# Patient Record
Sex: Female | Born: 1990 | Race: White | Hispanic: No | Marital: Single | Smoking: Former smoker
Health system: Southern US, Community
[De-identification: ages and names within clinical notes are randomized; demographics above are authoritative.]

## PROBLEM LIST (undated history)

## (undated) DIAGNOSIS — R569 Unspecified convulsions: Secondary | ICD-10-CM

## (undated) DIAGNOSIS — F419 Anxiety disorder, unspecified: Secondary | ICD-10-CM

## (undated) DIAGNOSIS — D649 Anemia, unspecified: Secondary | ICD-10-CM

## (undated) HISTORY — PX: EYE SURGERY: SHX253

## (undated) NOTE — *Deleted (*Deleted)
History     CSN: [id number]  Arrival date and time: 09/06/20 1925   First Provider Initiated Contact with Patient 09/06/20 2031      Chief Complaint  Patient presents with  . Contractions   Ms. Taylor Hodge is a 80 y.o. year old [phone] female at [redacted]w[redacted]d weeks gestation who presents to MAU reporting nausea and loose, watery stool since 1700 today. She denies any sick contacts. She reports (+) FM. She receives her [facility] with [facility] in [place], [place].   [place] History    Gravida  9   Para  5   Term  1   Preterm  4   AB  3   Living  5     SAB  2   TAB  1   Ectopic      Multiple      Live Births  5           Past Medical History:  Diagnosis Date  . Anemia   . Anxiety   . Seizures (HCC)     Past Surgical History:  Procedure Laterality Date  . CESAREAN SECTION    . EYE SURGERY      History reviewed. No pertinent family history.  Social History   Tobacco Use  . Smoking status: Former Games developer  . Smokeless tobacco: Never Used  Vaping Use  . Vaping Use: Never used  Substance Use Topics  . Alcohol use: Not Currently  . Drug use: Never    Allergies:  Allergies  Allergen Reactions  . Toradol [Ketorolac Tromethamine] Shortness Of Breath  . Compazine [Prochlorperazine] Anxiety    hives    Medications Prior to Admission  Medication Sig Dispense Refill Last Dose  . acetaminophen (TYLENOL) 500 MG tablet Take 1,000 mg by mouth every 6 (six) hours as needed for moderate pain.   09/06/2020 at Unknown time  . cyclobenzaprine (FLEXERIL) 10 MG tablet Take 1 tablet (10 mg total) by mouth 3 (three) times daily as needed for muscle spasms. 30 tablet 2 09/05/2020 at Unknown time  . folic acid (FOLVITE) 1 MG tablet Take 1 mg by mouth daily.   09/06/2020 at Unknown time  . lamoTRIgine (LAMICTAL) 25 MG tablet Take 25 mg by mouth 2 (two) times daily.   09/06/2020 at Unknown time  . Elastic Bandages & Supports (COMFORT FIT MATERNITY SUPP  MED) MISC 1 Device by Does not apply route daily. 1 each 1     Review of Systems  Constitutional: Negative.   HENT: Negative.   Eyes: Negative.   Respiratory: Negative.   Cardiovascular: Negative.   Gastrointestinal: Positive for abdominal pain, diarrhea and nausea.  Endocrine: Negative.   Genitourinary: Negative.   Musculoskeletal: Negative.   Skin: Negative.   Allergic/Immunologic: Negative.   Neurological: Negative.   Hematological: Negative.   Psychiatric/Behavioral: Negative.    Physical Exam   Blood pressure (!) 105/59, pulse (!) 106, temperature 98 F (36.7 C), resp. rate 18, height 5' (1.524 m), weight 72.6 kg, SpO2 100 %.  Physical Exam Constitutional:      Appearance: Normal appearance.  HENT:     Head: Normocephalic and atraumatic.     Nose: Nose normal.  Cardiovascular:     Rate and Rhythm: Tachycardia present.  Pulmonary:     Effort: Pulmonary effort is normal.  Abdominal:     Palpations: Abdomen is soft.  Genitourinary:    Comments: deferred Musculoskeletal:        General: Normal  range of motion.     Cervical back: Normal range of motion.  Skin:    General: Skin is warm and dry.  Neurological:     Mental Status: She is alert and oriented to person, place, and time.  Psychiatric:        Mood and Affect: Mood normal.        Behavior: Behavior normal.        Thought Content: Thought content normal.        Judgment: Judgment normal.    REACTIVE NST - FHR: 140 bpm / moderate variability / accels present / decels absent / TOCO: occ UC's with UI - difficult to distinguish d/t patient rolling from side to side moaning in pain MAU Course  Procedures  MDM LR 1000 mL @ 999 ml/hr Zofran 4 mg IVP Benadryl 50 mg slow IVP CBC w/ Diff BMP  Results for orders placed or performed during the hospital encounter of 09/06/20 (from the past 24 hour(s))  Urinalysis, Routine w reflex microscopic Urine, Clean Catch     Status: Abnormal   Collection Time: 09/06/20   7:53 PM  Result Value Ref Range   Color, Urine YELLOW YELLOW   APPearance HAZY (A) CLEAR   Specific Gravity, Urine 1.030 1.005 - 1.030   pH 5.0 5.0 - 8.0   Glucose, UA NEGATIVE NEGATIVE mg/dL   Hgb urine dipstick NEGATIVE NEGATIVE   Bilirubin Urine NEGATIVE NEGATIVE   Ketones, ur 5 (A) NEGATIVE mg/dL   Protein, ur 30 (A) NEGATIVE mg/dL   Nitrite NEGATIVE NEGATIVE   Leukocytes,Ua SMALL (A) NEGATIVE   RBC / HPF 0-5 0 - 5 RBC/hpf   WBC, UA 0-5 0 - 5 WBC/hpf   Bacteria, UA RARE (A) NONE SEEN   Squamous Epithelial / LPF 0-5 0 - 5   Mucus PRESENT   CBC with Differential/Platelet     Status: Abnormal   Collection Time: 09/06/20  9:12 PM  Result Value Ref Range   WBC 14.9 (H) 4.0 - 10.5 K/uL   RBC 4.09 3.87 - 5.11 MIL/uL   Hemoglobin 12.5 12.0 - 15.0 g/dL   HCT 29.9 36 - 46 %   MCV 92.4 80.0 - 100.0 fL   MCH 30.6 26.0 - 34.0 pg   MCHC 33.1 30.0 - 36.0 g/dL   RDW 24.2 68.3 - 41.9 %   Platelets 181 150 - 400 K/uL   nRBC 0.0 0.0 - 0.2 %   Neutrophils Relative % 84 %   Neutro Abs 12.5 (H) 1.7 - 7.7 K/uL   Lymphocytes Relative 9 %   Lymphs Abs 1.4 0.7 - 4.0 K/uL   Monocytes Relative 6 %   Monocytes Absolute 1.0 0.1 - 1.0 K/uL   Eosinophils Relative 0 %   Eosinophils Absolute 0.0 0.0 - 0.5 K/uL   Basophils Relative 0 %   Basophils Absolute 0.0 0.0 - 0.1 K/uL   Immature Granulocytes 1 %   Abs Immature Granulocytes 0.08 (H) 0.00 - 0.07 K/uL  Basic metabolic panel     Status: Abnormal   Collection Time: 09/06/20  9:12 PM  Result Value Ref Range   Sodium 138 135 - 145 mmol/L   Potassium 3.6 3.5 - 5.1 mmol/L   Chloride 109 98 - 111 mmol/L   CO2 19 (L) 22 - 32 mmol/L   Glucose, Bld 94 70 - 99 mg/dL   BUN 10 6 - 20 mg/dL   Creatinine, Ser 6.22 0.44 - 1.00 mg/dL   Calcium 8.5 (L)  8.9 - 10.3 mg/dL   GFR, Estimated >16 >10 mL/min   Anion gap 10 5 - 15    Report given to and care assumed by Wynelle Bourgeois, CNM @ 2200  Raelyn Mora, MSN, CNM 09/06/2020, 8:32 PM  2305:   Had a  flushing reaction to MVI infusion Stopped infusion and she felt better Will give a liter of NS instead Vomiting has diminished Still some intestinal cramping.   Assessment and Plan

---
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
## 2015-08-31 DIAGNOSIS — G40909 Epilepsy, unspecified, not intractable, without status epilepticus: Secondary | ICD-10-CM | POA: Insufficient documentation

## 2015-08-31 DIAGNOSIS — O99350 Diseases of the nervous system complicating pregnancy, unspecified trimester: Secondary | ICD-10-CM | POA: Insufficient documentation

## 2015-08-31 DIAGNOSIS — F1911 Other psychoactive substance abuse, in remission: Secondary | ICD-10-CM | POA: Insufficient documentation

## 2018-05-06 DIAGNOSIS — K219 Gastro-esophageal reflux disease without esophagitis: Secondary | ICD-10-CM | POA: Insufficient documentation

## 2018-06-15 DIAGNOSIS — R7401 Elevation of levels of liver transaminase levels: Secondary | ICD-10-CM | POA: Insufficient documentation

## 2018-06-17 DIAGNOSIS — R87612 Low grade squamous intraepithelial lesion on cytologic smear of cervix (LGSIL): Secondary | ICD-10-CM | POA: Insufficient documentation

## 2018-07-01 DIAGNOSIS — F959 Tic disorder, unspecified: Secondary | ICD-10-CM | POA: Insufficient documentation

## 2018-09-07 DIAGNOSIS — F411 Generalized anxiety disorder: Secondary | ICD-10-CM | POA: Insufficient documentation

## 2018-09-07 DIAGNOSIS — F3132 Bipolar disorder, current episode depressed, moderate: Secondary | ICD-10-CM | POA: Insufficient documentation

## 2018-10-05 DIAGNOSIS — G40909 Epilepsy, unspecified, not intractable, without status epilepticus: Secondary | ICD-10-CM

## 2018-12-14 DIAGNOSIS — F1121 Opioid dependence, in remission: Secondary | ICD-10-CM | POA: Insufficient documentation

## 2018-12-14 DIAGNOSIS — F902 Attention-deficit hyperactivity disorder, combined type: Secondary | ICD-10-CM | POA: Insufficient documentation

## 2019-05-03 DIAGNOSIS — R030 Elevated blood-pressure reading, without diagnosis of hypertension: Secondary | ICD-10-CM | POA: Insufficient documentation

## 2019-05-03 DIAGNOSIS — F17210 Nicotine dependence, cigarettes, uncomplicated: Secondary | ICD-10-CM | POA: Insufficient documentation

## 2020-03-07 DIAGNOSIS — O34219 Maternal care for unspecified type scar from previous cesarean delivery: Secondary | ICD-10-CM

## 2020-03-07 DIAGNOSIS — O09899 Supervision of other high risk pregnancies, unspecified trimester: Secondary | ICD-10-CM

## 2020-03-09 DIAGNOSIS — R768 Other specified abnormal immunological findings in serum: Secondary | ICD-10-CM | POA: Insufficient documentation

## 2020-05-09 DIAGNOSIS — O99210 Obesity complicating pregnancy, unspecified trimester: Secondary | ICD-10-CM | POA: Insufficient documentation

## 2020-05-09 DIAGNOSIS — O2613 Low weight gain in pregnancy, third trimester: Secondary | ICD-10-CM | POA: Insufficient documentation

## 2020-05-13 ENCOUNTER — Emergency Department (HOSPITAL_COMMUNITY): Payer: Medicaid Other

## 2020-05-13 ENCOUNTER — Emergency Department (HOSPITAL_COMMUNITY)
Admission: EM | Admit: 2020-05-13 | Discharge: 2020-05-14 | Disposition: A | Discharge status: Home / Self Care | Payer: Medicaid Other | Attending: Emergency Medicine | Admitting: Emergency Medicine

## 2020-05-13 ENCOUNTER — Other Ambulatory Visit: Payer: Self-pay

## 2020-05-13 DIAGNOSIS — R0602 Shortness of breath: Secondary | ICD-10-CM | POA: Diagnosis not present

## 2020-05-13 DIAGNOSIS — Z5321 Procedure and treatment not carried out due to patient leaving prior to being seen by health care provider: Secondary | ICD-10-CM | POA: Insufficient documentation

## 2020-05-13 DIAGNOSIS — R079 Chest pain, unspecified: Secondary | ICD-10-CM | POA: Diagnosis not present

## 2020-05-13 DIAGNOSIS — O99352 Diseases of the nervous system complicating pregnancy, second trimester: Secondary | ICD-10-CM | POA: Diagnosis not present

## 2020-05-13 DIAGNOSIS — Z3A17 17 weeks gestation of pregnancy: Secondary | ICD-10-CM | POA: Diagnosis not present

## 2020-05-13 DIAGNOSIS — R55 Syncope and collapse: Secondary | ICD-10-CM | POA: Diagnosis not present

## 2020-05-13 DIAGNOSIS — R42 Dizziness and giddiness: Secondary | ICD-10-CM | POA: Diagnosis not present

## 2020-05-13 LAB — URINALYSIS, ROUTINE W REFLEX MICROSCOPIC
Bilirubin Urine: NEGATIVE
Glucose, UA: NEGATIVE mg/dL
Hgb urine dipstick: NEGATIVE
Ketones, ur: NEGATIVE mg/dL
Leukocytes,Ua: NEGATIVE
Nitrite: NEGATIVE
Protein, ur: NEGATIVE mg/dL
Specific Gravity, Urine: 1.023 (ref 1.005–1.030)
pH: 6 (ref 5.0–8.0)

## 2020-05-13 LAB — CBC
HCT: 35 % — ABNORMAL LOW (ref 36.0–46.0)
Hemoglobin: 12.1 g/dL (ref 12.0–15.0)
MCH: 31.1 pg (ref 26.0–34.0)
MCHC: 34.6 g/dL (ref 30.0–36.0)
MCV: 90 fL (ref 80.0–100.0)
Platelets: 182 10*3/uL (ref 150–400)
RBC: 3.89 MIL/uL (ref 3.87–5.11)
RDW: 12.6 % (ref 11.5–15.5)
WBC: 7.4 10*3/uL (ref 4.0–10.5)
nRBC: 0 % (ref 0.0–0.2)

## 2020-05-13 LAB — I-STAT BETA HCG BLOOD, ED (MC, WL, AP ONLY): I-stat hCG, quantitative: >2000 m[IU]/mL — ABNORMAL HIGH (ref <5)

## 2020-05-13 MED ORDER — SODIUM CHLORIDE 0.9% FLUSH: 3.0000 mL | Freq: Once | INTRAVENOUS | Status: DC

## 2020-05-13 NOTE — ED Triage Notes (Signed)
Pt presents to ED POV. Pt c/o dizziness, syncope, CP, and SOB. Pt reports that she had 2 syncopal episodes today. Denies head trauma. Pt reports central CP that radiates towards shoulder. Pt is 17w pregnant, LMP -  01/17/2020. G9P5

## 2020-05-14 LAB — BASIC METABOLIC PANEL
Anion gap: 12 (ref 5–15)
BUN: 10 mg/dL (ref 6–20)
CO2: 19 mmol/L — ABNORMAL LOW (ref 22–32)
Calcium: 9.1 mg/dL (ref 8.9–10.3)
Chloride: 108 mmol/L (ref 98–111)
Creatinine, Ser: 0.6 mg/dL (ref 0.44–1.00)
GFR calc Af Amer: >60 mL/min (ref >60)
GFR calc non Af Amer: >60 mL/min (ref >60)
Glucose, Bld: 95 mg/dL (ref 70–99)
Potassium: 3.3 mmol/L — ABNORMAL LOW (ref 3.5–5.1)
Sodium: 139 mmol/L (ref 135–145)

## 2020-05-14 LAB — TROPONIN I (HIGH SENSITIVITY): Troponin I (High Sensitivity): <2 ng/L (ref <18)

## 2020-05-14 NOTE — ED Notes (Signed)
Stated she was leaving. 

## 2020-06-03 ENCOUNTER — Other Ambulatory Visit: Payer: Self-pay

## 2020-06-03 ENCOUNTER — Encounter (HOSPITAL_COMMUNITY): Payer: Self-pay | Admitting: Student

## 2020-06-03 ENCOUNTER — Inpatient Hospital Stay (HOSPITAL_COMMUNITY)
Admission: EM | Admit: 2020-06-03 | Discharge: 2020-06-04 | Disposition: A | Discharge status: Home / Self Care | Payer: Medicaid Other | Attending: Family Medicine | Admitting: Family Medicine

## 2020-06-03 DIAGNOSIS — Z79899 Other long term (current) drug therapy: Secondary | ICD-10-CM | POA: Insufficient documentation

## 2020-06-03 DIAGNOSIS — R109 Unspecified abdominal pain: Secondary | ICD-10-CM | POA: Insufficient documentation

## 2020-06-03 DIAGNOSIS — O99891 Other specified diseases and conditions complicating pregnancy: Secondary | ICD-10-CM

## 2020-06-03 DIAGNOSIS — M549 Dorsalgia, unspecified: Secondary | ICD-10-CM

## 2020-06-03 DIAGNOSIS — O99352 Diseases of the nervous system complicating pregnancy, second trimester: Secondary | ICD-10-CM | POA: Diagnosis not present

## 2020-06-03 DIAGNOSIS — Z87891 Personal history of nicotine dependence: Secondary | ICD-10-CM | POA: Diagnosis not present

## 2020-06-03 DIAGNOSIS — O26892 Other specified pregnancy related conditions, second trimester: Secondary | ICD-10-CM

## 2020-06-03 DIAGNOSIS — Z3A19 19 weeks gestation of pregnancy: Secondary | ICD-10-CM | POA: Diagnosis not present

## 2020-06-03 DIAGNOSIS — G40909 Epilepsy, unspecified, not intractable, without status epilepticus: Secondary | ICD-10-CM | POA: Insufficient documentation

## 2020-06-03 HISTORY — DX: Unspecified convulsions: R56.9

## 2020-06-03 NOTE — MAU Note (Signed)
Pt reports severe lower back and lower abd pain since this am. Denies vaginal bleeding. Denies dysuria. Pt reports she is [redacted] weeks pregnant

## 2020-06-03 NOTE — ED Triage Notes (Signed)
Patient reports back and and lower abd pain. [redacted] weeks pregnant. EDD 01/17/20

## 2020-06-03 NOTE — ED Provider Notes (Signed)
[facility] EMERGENCY DEPARTMENT Provider Note   CSN: [id number] Arrival date & time: 06/03/20  2244     History Chief Complaint  Patient presents with  . Back Pain  . Abdominal Pain    Taylor Hodge is a 29 y.o. female.  The history is provided by the patient and medical records.  Back Pain Associated symptoms: abdominal pain   Abdominal Pain   29 year old G7, P6 approximately [redacted] weeks gestation, presenting to the ED with abdominal and back pain.  States this began today.  Pain is mostly in her low back and throughout her entire abdomen.  She reports some nausea but denies any vomiting.  No urinary symptoms.  She denies any vaginal bleeding, does feel like she had a "little fluid leaking", not sure if it is discharge or not.  She has been followed by OB in [place] but did not feel she could make it there and was told to come to closest facility.  She does have a history of multiple pre-term labors with other children.  Denies issues related to this pregnancy thus far.    History reviewed. No pertinent past medical history.  There are no problems to display for this patient.   History reviewed. No pertinent surgical history.   OB History   No obstetric history on file.     History reviewed. No pertinent family history.  Social History   Tobacco Use  . Smoking status: Not on file  Substance Use Topics  . Alcohol use: Not on file  . Drug use: Not on file    Home Medications Prior to Admission medications   Not on File    Allergies    Compazine [prochlorperazine] and Toradol [ketorolac tromethamine]  Review of Systems   Review of Systems  Gastrointestinal: Positive for abdominal pain.  Musculoskeletal: Positive for back pain.  All other systems reviewed and are negative.   Physical Exam Updated Vital Signs BP 123/77 (BP Location: Left Arm)   Pulse 82   Temp 98.8 F (37.1 C) (Oral)   Resp 16   Ht 5' (1.524 m)   Wt 68 kg   SpO2  100%   BMI 29.28 kg/m   Physical Exam Vitals and nursing note reviewed.  Constitutional:      Appearance: She is well-developed.     Comments: Appears uncomfortable, rocking back and forth in exam chair holding her abdomen  HENT:     Head: Normocephalic and atraumatic.  Eyes:     Conjunctiva/sclera: Conjunctivae normal.     Pupils: Pupils are equal, round, and reactive to light.  Cardiovascular:     Rate and Rhythm: Normal rate and regular rhythm.     Heart sounds: Normal heart sounds.  Pulmonary:     Effort: Pulmonary effort is normal.     Breath sounds: Normal breath sounds.  Abdominal:     General: Bowel sounds are normal.     Palpations: Abdomen is soft.     Comments: gravid  Musculoskeletal:        General: Normal range of motion.     Cervical back: Normal range of motion.  Skin:    General: Skin is warm and dry.  Neurological:     Mental Status: She is alert and oriented to person, place, and time.     ED Results / Procedures / Treatments   Labs (all labs ordered are listed, but only abnormal results are displayed) Labs Reviewed - No data to display  EKG None  Radiology No results found.  Procedures Procedures (including critical care time)  Medications Ordered in ED Medications - No data to display  ED Course  I have reviewed the triage vital signs and the nursing notes.  Pertinent labs & imaging results that were available during my care of the patient were reviewed by me and considered in my medical decision making (see chart for details).    MDM Rules/Calculators/A&P  29 year old G7P6 approximately [redacted] weeks gestation here with abdominal and back pain.  States "loss of fluids", not sure if discharge or not.  Denies vaginal bleeding.  She is hemodynamically stable, does appear uncomfortable as she is rocking back and forth in exam chair holding her abdomen.  Discussed with on call OB-GYN, Dr. Shawnie Pons-- will send to MAU for further work-up and ongoing  care.  Final Clinical Impression(s) / ED Diagnoses Final diagnoses:  Abdominal pain during pregnancy in second trimester    Rx / DC Orders ED Discharge Orders    None       Garlon Hatchet, PA-C 06/03/20 2312    Tilden Fossa, MD 06/04/20 [phone]

## 2020-06-04 DIAGNOSIS — Z3A19 19 weeks gestation of pregnancy: Secondary | ICD-10-CM

## 2020-06-04 DIAGNOSIS — R109 Unspecified abdominal pain: Secondary | ICD-10-CM

## 2020-06-04 DIAGNOSIS — O26892 Other specified pregnancy related conditions, second trimester: Secondary | ICD-10-CM

## 2020-06-04 DIAGNOSIS — M549 Dorsalgia, unspecified: Secondary | ICD-10-CM

## 2020-06-04 LAB — URINALYSIS, ROUTINE W REFLEX MICROSCOPIC
Bilirubin Urine: NEGATIVE
Glucose, UA: NEGATIVE mg/dL
Hgb urine dipstick: NEGATIVE
Ketones, ur: NEGATIVE mg/dL
Leukocytes,Ua: NEGATIVE
Nitrite: NEGATIVE
Protein, ur: NEGATIVE mg/dL
Specific Gravity, Urine: 1.018 (ref 1.005–1.030)
pH: 6 (ref 5.0–8.0)

## 2020-06-04 LAB — RAPID URINE DRUG SCREEN, HOSP PERFORMED
Amphetamines: NOT DETECTED
Barbiturates: NOT DETECTED
Benzodiazepines: NOT DETECTED
Cocaine: NOT DETECTED
Opiates: NOT DETECTED
Tetrahydrocannabinol: NOT DETECTED

## 2020-06-04 LAB — WET PREP, GENITAL
Clue Cells Wet Prep HPF POC: NONE SEEN
Sperm: NONE SEEN
Trich, Wet Prep: NONE SEEN
Yeast Wet Prep HPF POC: NONE SEEN

## 2020-06-04 MED ORDER — CYCLOBENZAPRINE HCL 5 MG PO TABS
10.0000 mg | ORAL_TABLET | Freq: Once | ORAL | Status: AC
  Administered 2020-06-04: 10 mg via ORAL
  Filled 2020-06-04: qty 2

## 2020-06-04 MED ORDER — GABAPENTIN 300 MG PO CAPS
600.0000 mg | ORAL_CAPSULE | Freq: Once | ORAL | Status: AC
  Administered 2020-06-04: 600 mg via ORAL
  Filled 2020-06-04: qty 2

## 2020-06-04 NOTE — MAU Provider Note (Signed)
History     CSN: [id number]  Arrival date and time: 06/03/20 2244   First Provider Initiated Contact with Patient 06/04/20 0036      Chief Complaint  Patient presents with  . Back Pain  . Abdominal Pain   Taylor Hodge is a 29 y.o. [id number] at [redacted]w[redacted]d who receives care at [facility].  She presents today for Back Pain and Abdominal Pain.  She states she woke up around 12pm and noticed she was having abdominal pain.  She states the pain is intermittent and located in her lower abdomen.  She describes it as "tight" and rates it a 9/10.  She has been taking tylenol Q4 hours for the past 12 hours without relief of her symptoms.  She states her back pain has been present for the same time and is constant.  She states the pain is located in her bilateral lower back and is "excruitiating."  Patient goes on to rate her pain a 12/10! Patient denies vaginal bleeding and reports vaginal "mucous."  She endorses fetal movement. Patient reports she works at [employer] and endorses that she is on her feet regularly.    OB History    Gravida  9   Para  5   Term  1   Preterm  4   AB  3   Living  5     SAB  2   TAB  1   Ectopic      Multiple      Live Births              Past Medical History:  Diagnosis Date  . Seizures (HCC)     Past Surgical History:  Procedure Laterality Date  . EYE SURGERY      History reviewed. No pertinent family history.  Social History   Tobacco Use  . Smoking status: Former Games developer  . Smokeless tobacco: Never Used  Vaping Use  . Vaping Use: Never used  Substance Use Topics  . Alcohol use: Not Currently  . Drug use: Never    Allergies:  Allergies  Allergen Reactions  . Compazine [Prochlorperazine]     hives  . Toradol [Ketorolac Tromethamine]     CP    Medications Prior to Admission  Medication Sig Dispense Refill Last Dose  . folic acid (FOLVITE) 1 MG tablet Take 1 mg by mouth daily.   06/03/2020 at Unknown time  . lamoTRIgine  (LAMICTAL) 25 MG tablet Take 25 mg by mouth 2 (two) times daily.   06/03/2020 at Unknown time    Review of Systems  Constitutional: Negative for chills and fever.  Respiratory: Negative for cough and shortness of breath.   Gastrointestinal: Positive for abdominal pain and diarrhea.  Genitourinary: Positive for pelvic pain (Pressure) and vaginal discharge. Negative for difficulty urinating, dysuria and vaginal bleeding.  Musculoskeletal: Positive for back pain.  Neurological: Positive for dizziness. Negative for light-headedness and headaches.   Physical Exam   Blood pressure 115/67, pulse 85, temperature 98.3 F (36.8 C), resp. rate 15, height 5' (1.524 m), weight 68 kg, SpO2 100 %.  Physical Exam Vitals and nursing note reviewed. Exam conducted with a chaperone present.  Constitutional:      Appearance: She is well-developed.  HENT:     Head: Normocephalic and atraumatic.  Eyes:     Conjunctiva/sclera: Conjunctivae normal.  Cardiovascular:     Rate and Rhythm: Normal rate and regular rhythm.     Heart sounds: Normal heart sounds.  Pulmonary:     Effort: Pulmonary effort is normal.     Breath sounds: Normal breath sounds.  Abdominal:     General: Bowel sounds are normal.     Palpations: Abdomen is soft.     Tenderness: There is generalized abdominal tenderness.  Genitourinary:    Vagina: No vaginal discharge, tenderness, bleeding or lesions.     Cervix: No cervical motion tenderness, discharge, erythema or cervical bleeding.     Uterus: Enlarged.      Comments: Speculum Exam: -Normal External Genitalia: Clitoral piercing. Non tender, no apparent discharge at introitus.  -Vaginal Vault: Pink mucosa with good rugae. No apparent discharge -wet prep collected -Cervix:Pink, no lesions, cysts, or polyps. Small amt of white curd near os with some white plaques on anterior aspect. Cervix appears closed. No active bleeding from os-GC/CT collected -Bimanual Exam:  No tenderness in cul  de sac or at fundus.   Skin:    General: Skin is warm and dry.  Neurological:     Mental Status: She is alert and oriented to person, place, and time.  Psychiatric:        Mood and Affect: Mood is anxious. Mood is not depressed.        Thought Content: Thought content normal.     MAU Course  Procedures Results for orders placed or performed during the hospital encounter of 06/03/20 (from the past 24 hour(s))  Urinalysis, Routine w reflex microscopic Urine, Clean Catch     Status: Abnormal   Collection Time: 06/03/20 11:48 PM  Result Value Ref Range   Color, Urine YELLOW YELLOW   APPearance HAZY (A) CLEAR   Specific Gravity, Urine 1.018 1.005 - 1.030   pH 6.0 5.0 - 8.0   Glucose, UA NEGATIVE NEGATIVE mg/dL   Hgb urine dipstick NEGATIVE NEGATIVE   Bilirubin Urine NEGATIVE NEGATIVE   Ketones, ur NEGATIVE NEGATIVE mg/dL   Protein, ur NEGATIVE NEGATIVE mg/dL   Nitrite NEGATIVE NEGATIVE   Leukocytes,Ua NEGATIVE NEGATIVE  Wet prep, genital     Status: Abnormal   Collection Time: 06/04/20 12:55 AM  Result Value Ref Range   Yeast Wet Prep HPF POC NONE SEEN NONE SEEN   Trich, Wet Prep NONE SEEN NONE SEEN   Clue Cells Wet Prep HPF POC NONE SEEN NONE SEEN   WBC, Wet Prep HPF POC MANY (A) NONE SEEN   Sperm NONE SEEN     MDM Pelvic Exam with Cultures Muscle Relaxant Assessment and Plan  29 year old, [id number]  SIUP at 19.6weeks Abdominal Pain Back Pain  -Reviewed POC with patient. -Exam performed and findings discussed.  -Cultures collected and pending.  -Informed that exam mildly suspicious for yeast, but would await results before treating.  -Reviewed UA which shows no concern for cystitis or nephrolithiasis.  -Discussed usage of maternity support belt/band while at work. -Will give flexeril for pain. -Discussed usage of heat to back for improved comfort.  Order placed.  -Will monitor and reassess.  Taylor Hodge 06/04/2020, 12:36 AM   Reassessment (1:40 AM) -Patient  reports no relief with flexeril dosing and some improvement with heat application.  -Patient now rates her pain a 10/10.  -Dr. Gildardo Griffes consulted and updated on patient status and results. Advised: *Okay to try gabapentin *Reforce usage of heat. *Okay for prescription for flexeril if desired. *Continue tylenol dosing at home. *Follow up with primary as appropriate.  -Provider to bedside and patient informed of POC. -Verbalizes understanding and questions if pain should  be "this bad."  Discussed how perception of pain is individualized and that comfort measures are first line of managing. -Patient without further questions or concerns. -Will return to reassess.   Reassessment (3:25 AM)  -Nurse reports patient in room asleep. -Nurse instructed to give discharge instructions including: -Encouraging patient to call primary ob or return to MAU if symptoms worsen or with the onset of new symptoms. -Discharged to home in stable condition.  Taylor Robins MSN, CNM Advanced Practice Provider, [facility]

## 2020-06-04 NOTE — Discharge Instructions (Signed)
Abdominal Pain During Pregnancy  Abdominal pain is common during pregnancy, and has many possible causes. Some causes are more serious than others, and sometimes the cause is not known. Abdominal pain can be a sign that labor is starting. It can also be caused by normal growth and stretching of muscles and ligaments during pregnancy. Always tell your health care provider if you have any abdominal pain. Follow these instructions at home:  Do not have sex or put anything in your vagina until your pain goes away completely.  Get plenty of rest until your pain improves.  Drink enough fluid to keep your urine pale yellow.  Take over-the-counter and prescription medicines only as told by your health care provider.  Keep all follow-up visits as told by your health care provider. This is important. Contact a health care provider if:  Your pain continues or gets worse after resting.  You have lower abdominal pain that: ? Comes and goes at regular intervals. ? Spreads to your back. ? Is similar to menstrual cramps.  You have pain or burning when you urinate. Get help right away if:  You have a fever or chills.  You have vaginal bleeding.  You are leaking fluid from your vagina.  You are passing tissue from your vagina.  You have vomiting or diarrhea that lasts for more than 24 hours.  Your baby is moving less than usual.  You feel very weak or faint.  You have shortness of breath.  You develop severe pain in your upper abdomen. Summary  Abdominal pain is common during pregnancy, and has many possible causes.  If you experience abdominal pain during pregnancy, tell your health care provider right away.  Follow your health care provider's home care instructions and keep all follow-up visits as directed. This information is not intended to replace advice given to you by your health care provider. Make sure you discuss any questions you have with your health care  provider. Document Revised: 01/25/2019 Document Reviewed: 01/09/2017 Elsevier Patient Education  2020 Elsevier Inc. Back Pain in Pregnancy Back pain during pregnancy is common. Back pain may be caused by several factors that are related to changes during your pregnancy. Follow these instructions at home: Managing pain, stiffness, and swelling      If directed, for sudden (acute) back pain, put ice on the painful area. ? Put ice in a plastic bag. ? Place a towel between your skin and the bag. ? Leave the ice on for 20 minutes, 2-3 times per day.  If directed, apply heat to the affected area before you exercise. Use the heat source that your health care provider recommends, such as a moist heat pack or a heating pad. ? Place a towel between your skin and the heat source. ? Leave the heat on for 20-30 minutes. ? Remove the heat if your skin turns bright red. This is especially important if you are unable to feel pain, heat, or cold. You may have a greater risk of getting burned.  If directed, massage the affected area. Activity  Exercise as told by your health care provider. Gentle exercise is the best way to prevent or manage back pain.  Listen to your body when lifting. If lifting hurts, ask for help or bend your knees. This uses your leg muscles instead of your back muscles.  Squat down when picking up something from the floor. Do not bend over.  Only use bed rest for short periods as told by your health  care provider. Bed rest should only be used for the most severe episodes of back pain. Standing, sitting, and lying down  Do not stand in one place for long periods of time.  Use good posture when sitting. Make sure your head rests over your shoulders and is not hanging forward. Use a pillow on your lower back if necessary.  Try sleeping on your side, preferably the left side, with a pregnancy support pillow or 1-2 regular pillows between your legs. ? If you have back pain  after a night's rest, your bed may be too soft. ? A firm mattress may provide more support for your back during pregnancy. General instructions  Do not wear high heels.  Eat a healthy diet. Try to gain weight within your health care provider's recommendations.  Use a maternity girdle, elastic sling, or back brace as told by your health care provider.  Take over-the-counter and prescription medicines only as told by your health care provider.  Work with a physical therapist or massage therapist to find ways to manage back pain. Acupuncture or massage therapy may be helpful.  Keep all follow-up visits as told by your health care provider. This is important. Contact a health care provider if:  Your back pain interferes with your daily activities.  You have increasing pain in other parts of your body. Get help right away if:  You develop numbness, tingling, weakness, or problems with the use of your arms or legs.  You develop severe back pain that is not controlled with medicine.  You have a change in bowel or bladder control.  You develop shortness of breath, dizziness, or you faint.  You develop nausea, vomiting, or sweating.  You have back pain that is a rhythmic, cramping pain similar to labor pains. Labor pain is usually 1-2 minutes apart, lasts for about 1 minute, and involves a bearing down feeling or pressure in your pelvis.  You have back pain and your water breaks or you have vaginal bleeding.  You have back pain or numbness that travels down your leg.  Your back pain developed after you fell.  You develop pain on one side of your back.  You see blood in your urine.  You develop skin blisters in the area of your back pain. Summary  Back pain may be caused by several factors that are related to changes during your pregnancy.  Follow instructions as told by your health care provider for managing pain, stiffness, and swelling.  Exercise as told by your health  care provider. Gentle exercise is the best way to prevent or manage back pain.  Take over-the-counter and prescription medicines only as told by your health care provider.  Keep all follow-up visits as told by your health care provider. This is important. This information is not intended to replace advice given to you by your health care provider. Make sure you discuss any questions you have with your health care provider. Document Revised: 01/26/2019 Document Reviewed: 03/25/2018 Elsevier Patient Education  2020 Elsevier Inc.   [facility] for [facility] at [facility]  [address] [place], [place] [address]  [phone]  Center for [facility] at [facility]  [address], [place], [place] [address]  [phone]  Center for [facility] at [facility] [address], [place], [place] [address]  [phone]  Center for [facility] at [facility]  [address] [place], [place] [address]  [phone]  Center for [facility] at [facility]  [address]), [place], [place] [address]  [phone]  Center for [facility] [address], [place], [place] [address] [phone]  Center for [facility] at [facility]  [address] [place], [place], [place] [address]  [phone]  [facility]  [address], [place], [place] [address]  [phone]  [facility]  [address] [place], [place], [place] [address]  [phone]  [facility]  [address] [place], [place] [address]  [phone]  [facility]  [address] [place], [place] [address]  [phone]  [facility]  [address], [place], [place] [address]  [phone]  [facility]   [address] [place], [place] [address]  [phone]  [facility]  [address], [place], [place]  [address]   [phone]  [facility]  [address] [place], [place] [address]  [phone]

## 2020-06-05 LAB — GC/CHLAMYDIA PROBE AMP (~~LOC~~) NOT AT ARMC
Chlamydia: NEGATIVE
Comment: NEGATIVE
Comment: NORMAL
Neisseria Gonorrhea: NEGATIVE

## 2020-06-07 ENCOUNTER — Encounter (HOSPITAL_COMMUNITY): Payer: Self-pay | Admitting: Emergency Medicine

## 2020-06-07 ENCOUNTER — Other Ambulatory Visit: Payer: Self-pay

## 2020-06-07 ENCOUNTER — Inpatient Hospital Stay (HOSPITAL_COMMUNITY)
Admission: EM | Admit: 2020-06-07 | Discharge: 2020-06-08 | Disposition: A | Discharge status: Home / Self Care | Payer: Medicaid Other | Attending: Obstetrics and Gynecology | Admitting: Obstetrics and Gynecology

## 2020-06-07 DIAGNOSIS — Z888 Allergy status to other drugs, medicaments and biological substances status: Secondary | ICD-10-CM | POA: Diagnosis not present

## 2020-06-07 DIAGNOSIS — M549 Dorsalgia, unspecified: Secondary | ICD-10-CM

## 2020-06-07 DIAGNOSIS — Z3A20 20 weeks gestation of pregnancy: Secondary | ICD-10-CM | POA: Insufficient documentation

## 2020-06-07 DIAGNOSIS — Z79899 Other long term (current) drug therapy: Secondary | ICD-10-CM | POA: Insufficient documentation

## 2020-06-07 DIAGNOSIS — M545 Low back pain: Secondary | ICD-10-CM | POA: Diagnosis not present

## 2020-06-07 DIAGNOSIS — M546 Pain in thoracic spine: Secondary | ICD-10-CM | POA: Insufficient documentation

## 2020-06-07 DIAGNOSIS — Z87891 Personal history of nicotine dependence: Secondary | ICD-10-CM | POA: Diagnosis not present

## 2020-06-07 DIAGNOSIS — O99892 Other specified diseases and conditions complicating childbirth: Secondary | ICD-10-CM | POA: Diagnosis not present

## 2020-06-07 DIAGNOSIS — O99891 Other specified diseases and conditions complicating pregnancy: Secondary | ICD-10-CM

## 2020-06-07 NOTE — MAU Note (Signed)
PT SAYS THIS MTH-  FOR BACK PAIN .  WENT TO [place] TONIGHT - TO GET COVID TEST FOR DAUGHTER . CHECKED IN AT PEDS SIDE.     SAYS HAS BACK PAIN - - UPPER AND LOWER-  TOOK XS TYLENOL 2 TABS- AT  8 PM.- NO RELIEF.  PNC WITH  [facility]. - MOVED HERE.  LAST SEX-  2 WEEKS AGO.

## 2020-06-07 NOTE — ED Provider Notes (Signed)
MSE was initiated and I personally evaluated the patient and placed orders (if any) at  10:15 PM on June 07, 2020.  29 year old female presents to the ED at [redacted] weeks gestation with complaint of low back pain and lower abdominal pain with heavy vaginal discharge.  Denies vaginal bleeding.  Patient states pain is similar to when she was seen here for same 2 weeks ago.  Patient is imaged by [facility].  Abdomen is soft and nontender, vitals reassuring, respirations even and unlabored. Discussed with MAU APP, patient may be seen at MAU.  The patient appears stable so that the remainder of the MSE may be completed by another provider.   Jeannie Fend, PA-C 06/07/20 2215    Tegeler, Canary Brim, MD 06/07/20 [phone]

## 2020-06-07 NOTE — ED Triage Notes (Signed)
Pt c/o lower back and abdominal pain, vaginal discharge and nausea. [redacted] weeks pregnant.

## 2020-06-08 DIAGNOSIS — Z3A20 20 weeks gestation of pregnancy: Secondary | ICD-10-CM

## 2020-06-08 DIAGNOSIS — O99892 Other specified diseases and conditions complicating childbirth: Secondary | ICD-10-CM

## 2020-06-08 DIAGNOSIS — M549 Dorsalgia, unspecified: Secondary | ICD-10-CM

## 2020-06-08 LAB — URINALYSIS, ROUTINE W REFLEX MICROSCOPIC
Bilirubin Urine: NEGATIVE
Glucose, UA: NEGATIVE mg/dL
Hgb urine dipstick: NEGATIVE
Ketones, ur: NEGATIVE mg/dL
Leukocytes,Ua: NEGATIVE
Nitrite: NEGATIVE
Protein, ur: NEGATIVE mg/dL
Specific Gravity, Urine: 1.025 (ref 1.005–1.030)
pH: 6 (ref 5.0–8.0)

## 2020-06-08 MED ORDER — CYCLOBENZAPRINE HCL 5 MG PO TABS
ORAL_TABLET | ORAL | Status: AC
  Administered 2020-06-08: 10 mg
  Filled 2020-06-08: qty 2

## 2020-06-08 MED ORDER — ACETAMINOPHEN 500 MG PO TABS
ORAL_TABLET | ORAL | Status: AC
  Administered 2020-06-08: 1000 mg
  Filled 2020-06-08: qty 2

## 2020-06-08 MED ORDER — CYCLOBENZAPRINE HCL 10 MG PO TABS
10.0000 mg | ORAL_TABLET | Freq: Three times a day (TID) | ORAL | 2 refills | Status: DC | PRN
Start: 2020-06-08 — End: 2021-02-26

## 2020-06-08 MED ORDER — GABAPENTIN 300 MG PO CAPS
300.0000 mg | ORAL_CAPSULE | Freq: Once | ORAL | Status: AC
  Administered 2020-06-08: 300 mg via ORAL
  Filled 2020-06-08: qty 1

## 2020-06-08 MED ORDER — COMFORT FIT MATERNITY SUPP MED MISC
1.0000 | Freq: Every day | 1 refills | Status: DC
Start: 2020-06-08 — End: 2021-02-26
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
## 2020-06-08 NOTE — Discharge Instructions (Signed)
Back Pain in Pregnancy Ask MD for referral to Ortho and ask about using Gabapentin for pain Back pain during pregnancy is common. Back pain may be caused by several factors that are related to changes during your pregnancy. Follow these instructions at home: Managing pain, stiffness, and swelling      If directed, for sudden (acute) back pain, put ice on the painful area. ? Put ice in a plastic bag. ? Place a towel between your skin and the bag. ? Leave the ice on for 20 minutes, 2-3 times per day.  If directed, apply heat to the affected area before you exercise. Use the heat source that your health care provider recommends, such as a moist heat pack or a heating pad. ? Place a towel between your skin and the heat source. ? Leave the heat on for 20-30 minutes. ? Remove the heat if your skin turns bright red. This is especially important if you are unable to feel pain, heat, or cold. You may have a greater risk of getting burned.  If directed, massage the affected area. Activity  Exercise as told by your health care provider. Gentle exercise is the best way to prevent or manage back pain.  Listen to your body when lifting. If lifting hurts, ask for help or bend your knees. This uses your leg muscles instead of your back muscles.  Squat down when picking up something from the floor. Do not bend over.  Only use bed rest for short periods as told by your health care provider. Bed rest should only be used for the most severe episodes of back pain. Standing, sitting, and lying down  Do not stand in one place for long periods of time.  Use good posture when sitting. Make sure your head rests over your shoulders and is not hanging forward. Use a pillow on your lower back if necessary.  Try sleeping on your side, preferably the left side, with a pregnancy support pillow or 1-2 regular pillows between your legs. ? If you have back pain after a night's rest, your bed may be too soft. ? A  firm mattress may provide more support for your back during pregnancy. General instructions  Do not wear high heels.  Eat a healthy diet. Try to gain weight within your health care provider's recommendations.  Use a maternity girdle, elastic sling, or back brace as told by your health care provider.  Take over-the-counter and prescription medicines only as told by your health care provider.  Work with a physical therapist or massage therapist to find ways to manage back pain. Acupuncture or massage therapy may be helpful.  Keep all follow-up visits as told by your health care provider. This is important. Contact a health care provider if:  Your back pain interferes with your daily activities.  You have increasing pain in other parts of your body. Get help right away if:  You develop numbness, tingling, weakness, or problems with the use of your arms or legs.  You develop severe back pain that is not controlled with medicine.  You have a change in bowel or bladder control.  You develop shortness of breath, dizziness, or you faint.  You develop nausea, vomiting, or sweating.  You have back pain that is a rhythmic, cramping pain similar to labor pains. Labor pain is usually 1-2 minutes apart, lasts for about 1 minute, and involves a bearing down feeling or pressure in your pelvis.  You have back pain and your water  breaks or you have vaginal bleeding.  You have back pain or numbness that travels down your leg.  Your back pain developed after you fell.  You develop pain on one side of your back.  You see blood in your urine.  You develop skin blisters in the area of your back pain. Summary  Back pain may be caused by several factors that are related to changes during your pregnancy.  Follow instructions as told by your health care provider for managing pain, stiffness, and swelling.  Exercise as told by your health care provider. Gentle exercise is the best way to  prevent or manage back pain.  Take over-the-counter and prescription medicines only as told by your health care provider.  Keep all follow-up visits as told by your health care provider. This is important. This information is not intended to replace advice given to you by your health care provider. Make sure you discuss any questions you have with your health care provider. Document Revised: 01/26/2019 Document Reviewed: 03/25/2018 Elsevier Patient Education  2020 [employer].

## 2020-07-05 ENCOUNTER — Encounter (HOSPITAL_COMMUNITY): Payer: Self-pay

## 2020-07-05 ENCOUNTER — Ambulatory Visit (HOSPITAL_COMMUNITY): Admission: EM | Admit: 2020-07-05 | Discharge: 2020-07-05 | Payer: Medicaid Other

## 2020-07-05 ENCOUNTER — Other Ambulatory Visit: Payer: Self-pay

## 2020-07-05 ENCOUNTER — Inpatient Hospital Stay (HOSPITAL_COMMUNITY)
Admission: AD | Admit: 2020-07-05 | Discharge: 2020-07-05 | Disposition: A | Discharge status: Home / Self Care | Payer: Medicaid Other | Attending: Obstetrics and Gynecology | Admitting: Obstetrics and Gynecology

## 2020-07-05 DIAGNOSIS — Z3A24 24 weeks gestation of pregnancy: Secondary | ICD-10-CM | POA: Diagnosis not present

## 2020-07-05 DIAGNOSIS — O26892 Other specified pregnancy related conditions, second trimester: Secondary | ICD-10-CM | POA: Insufficient documentation

## 2020-07-05 DIAGNOSIS — O99612 Diseases of the digestive system complicating pregnancy, second trimester: Secondary | ICD-10-CM | POA: Insufficient documentation

## 2020-07-05 DIAGNOSIS — H538 Other visual disturbances: Secondary | ICD-10-CM

## 2020-07-05 DIAGNOSIS — R109 Unspecified abdominal pain: Secondary | ICD-10-CM

## 2020-07-05 LAB — COMPREHENSIVE METABOLIC PANEL
ALT: 11 U/L (ref 0–44)
AST: 13 U/L — ABNORMAL LOW (ref 15–41)
Albumin: 3.3 g/dL — ABNORMAL LOW (ref 3.5–5.0)
Alkaline Phosphatase: 67 U/L (ref 38–126)
Anion gap: 10 (ref 5–15)
BUN: 9 mg/dL (ref 6–20)
CO2: 20 mmol/L — ABNORMAL LOW (ref 22–32)
Calcium: 8.5 mg/dL — ABNORMAL LOW (ref 8.9–10.3)
Chloride: 103 mmol/L (ref 98–111)
Creatinine, Ser: 0.58 mg/dL (ref 0.44–1.00)
GFR calc Af Amer: >60 mL/min (ref >60)
GFR calc non Af Amer: >60 mL/min (ref >60)
Glucose, Bld: 111 mg/dL — ABNORMAL HIGH (ref 70–99)
Potassium: 3.3 mmol/L — ABNORMAL LOW (ref 3.5–5.1)
Sodium: 133 mmol/L — ABNORMAL LOW (ref 135–145)
Total Bilirubin: 0.4 mg/dL (ref 0.3–1.2)
Total Protein: 6.3 g/dL — ABNORMAL LOW (ref 6.5–8.1)

## 2020-07-05 LAB — URINALYSIS, ROUTINE W REFLEX MICROSCOPIC
Bilirubin Urine: NEGATIVE
Glucose, UA: NEGATIVE mg/dL
Hgb urine dipstick: NEGATIVE
Ketones, ur: 20 mg/dL — AB
Leukocytes,Ua: NEGATIVE
Nitrite: NEGATIVE
Protein, ur: 30 mg/dL — AB
Specific Gravity, Urine: 1.034 — ABNORMAL HIGH (ref 1.005–1.030)
pH: 5 (ref 5.0–8.0)

## 2020-07-05 LAB — CBC
HCT: 33.6 % — ABNORMAL LOW (ref 36.0–46.0)
Hemoglobin: 11.7 g/dL — ABNORMAL LOW (ref 12.0–15.0)
MCH: 30.5 pg (ref 26.0–34.0)
MCHC: 34.8 g/dL (ref 30.0–36.0)
MCV: 87.7 fL (ref 80.0–100.0)
Platelets: 180 10*3/uL (ref 150–400)
RBC: 3.83 MIL/uL — ABNORMAL LOW (ref 3.87–5.11)
RDW: 12.4 % (ref 11.5–15.5)
WBC: 9.5 10*3/uL (ref 4.0–10.5)
nRBC: 0 % (ref 0.0–0.2)

## 2020-07-05 LAB — PROTEIN / CREATININE RATIO, URINE
Creatinine, Urine: 227.57 mg/dL
Protein Creatinine Ratio: 0.09 mg/mg{Cre} (ref 0.00–0.15)
Total Protein, Urine: 20 mg/dL

## 2020-07-05 NOTE — Discharge Instructions (Signed)

## 2020-07-05 NOTE — MAU Note (Signed)
Went to call another pt into triage and pt mentioned she had light spotting when went to [facility] few mins ago

## 2020-07-05 NOTE — ED Triage Notes (Signed)
Pt arrives POV for eval of abd pain/cramping, dizziness and leg swelling. Pt reports onset of sx this afternoon at work. Pt reports this is her 9th pregnancy

## 2020-07-05 NOTE — MAU Note (Addendum)
Pt has abd pain RUQ for a month that is worse when eating. Thinks maybe is gallbladder.  H/a, calves to feet on both legs are painful when walking. Symptoms have been present since this am. Denies VB. Some clear d/c that is normal. Pt lives in shelter with no family support. [facility]. Denies N/V. Watery diarrhea but states has had on and off for couple months

## 2020-07-05 NOTE — MAU Provider Note (Signed)
Chief Complaint:  Abdominal Pain, Pregnant, and Back Pain   First Provider Initiated Contact with Patient 07/05/20 2232    HPI: Taylor Hodge is a 29 y.o. [id number] at [redacted]w[redacted]d who presents to maternity admissions reporting central achy abdominal pain with associated nausea and diarrhea. Also reports episode of blurry vision which has since resolved. Patient was sent from the ED for further evaluation- rule out pre-eclampsia. Patient tells me her diet consists of greasy and spicy foods and was concerned whether this was due to her gallbladder. History of "liver failure" in 2013 due to Hep. C which has since been treated. No prior history of gallbladder issues or pre-eclampsia in prior pregnancies. Does have a headache but notes she has not had much to eat or drink today. She has a prenatal appointment tomorrow. Denies substance or alcohol use.   She reports good fetal movement, dizziness, vomiting, or fever/chills.    Location: central Quality: achy Severity: 8/10 on pain scale Duration: For a couple hours after eating Timing: Intermittent Modifying factors: Worse after eating Associated signs and symptoms: Nausea, diarrhea, no vomiting. RUQ and epigastric tenderness to palpation  HPI  Past Medical History: Past Medical History:  Diagnosis Date  . Seizures (HCC)     Past obstetric history: OB History  Gravida Para Term Preterm AB Living  9 5 1 4 3 5   SAB TAB Ectopic Multiple Live Births  2 1     5     # Outcome Date GA Lbr Len/2nd Weight Sex Delivery Anes PTL Lv  9 Current           8 TAB           7 SAB           6 SAB           5 Term      Vag-Spont     4 Preterm      CS-LTranv     3 Preterm      Vag-Spont     2 Preterm      Vag-Spont     1 Preterm      Vag-Spont       Past Surgical History: Past Surgical History:  Procedure Laterality Date  . EYE SURGERY      Family History: History reviewed. No pertinent family history.  Social History: Social History   Tobacco  Use  . Smoking status: Former  . Smokeless tobacco: Never Used  Vaping Use  . Vaping Use: Never used  Substance Use Topics  . Alcohol use: Not Currently  . Drug use: Never    Allergies:  Allergies  Allergen Reactions  . Compazine [Prochlorperazine]     hives  . Toradol [Ketorolac Tromethamine]     CP    Meds:  Medications Prior to Admission  Medication Sig Dispense Refill Last Dose  . folic acid (FOLVITE) 1 MG tablet Take 1 mg by mouth daily.   07/05/2020 at Unknown time  . lamoTRIgine (LAMICTAL) 25 MG tablet Take 25 mg by mouth 2 (two) times daily.   07/05/2020 at Unknown time  . cyclobenzaprine (FLEXERIL) 10 MG tablet Take 1 tablet (10 mg total) by mouth 3 (three) times daily as needed for muscle spasms. 30 tablet 2   . Elastic Bandages & Supports (COMFORT FIT MATERNITY SUPP MED) MISC 1 Device by Does not apply route daily. 1 each 1     ROS:  Review of Systems  Constitutional: Positive for appetite change.  Gastrointestinal: Positive for abdominal pain, diarrhea and nausea. Negative for vomiting.  Neurological: Positive for headaches.     I have reviewed patient's Past Medical Hx, Surgical Hx, Family Hx, Social Hx, medications and allergies.   Physical Exam   Patient Vitals for the past 24 hrs:  BP Temp Pulse Resp SpO2 Height Weight  07/05/20 2215 107/61 -- 99 -- -- -- --  07/05/20 1938 -- -- -- -- 100 % -- --  07/05/20 1931 112/85 -- (!) 109 -- -- -- --  07/05/20 1930 -- 98.1 F (36.7 C) -- 18 -- 5' (1.524 m) 70.3 kg  07/05/20 1807 122/84 98.6 F (37 C) (!) 104 18 100 % 5' (1.524 m) 69.4 kg   Constitutional: Well-developed, well-nourished female in no acute distress.  Cardiovascular: normal rate Respiratory: normal effort GI: Abd soft, tenderness to RUQ and epigastric regions, gravid appropriate for gestational age.  MS: Extremities nontender, no edema, normal ROM Neurologic: Alert and oriented GU: Neg CVAT on left, right CVAT.  PELVIC EXAM: deferred       FHT:  Baseline 145 , moderate variability, accelerations present, no decelerations Contractions: none   Labs: Results for orders placed or performed during the hospital encounter of 07/05/20 (from the past 24 hour(s))  CBC     Status: Abnormal   Collection Time: 07/05/20  7:40 PM  Result Value Ref Range   WBC 9.5 4.0 - 10.5 K/uL   RBC 3.83 (L) 3.87 - 5.11 MIL/uL   Hemoglobin 11.7 (L) 12.0 - 15.0 g/dL   HCT 31.5 (L) 36 - 46 %   MCV 87.7 80.0 - 100.0 fL   MCH 30.5 26.0 - 34.0 pg   MCHC 34.8 30.0 - 36.0 g/dL   RDW 17.6 16.0 - 73.7 %   Platelets 180 150 - 400 K/uL   nRBC 0.0 0.0 - 0.2 %  Comprehensive metabolic panel     Status: Abnormal   Collection Time: 07/05/20  7:40 PM  Result Value Ref Range   Sodium 133 (L) 135 - 145 mmol/L   Potassium 3.3 (L) 3.5 - 5.1 mmol/L   Chloride 103 98 - 111 mmol/L   CO2 20 (L) 22 - 32 mmol/L   Glucose, Bld 111 (H) 70 - 99 mg/dL   BUN 9 6 - 20 mg/dL   Creatinine, Ser 1.06 0.44 - 1.00 mg/dL   Calcium 8.5 (L) 8.9 - 10.3 mg/dL   Total Protein 6.3 (L) 6.5 - 8.1 g/dL   Albumin 3.3 (L) 3.5 - 5.0 g/dL   AST 13 (L) 15 - 41 U/L   ALT 11 0 - 44 U/L   Alkaline Phosphatase 67 38 - 126 U/L   Total Bilirubin 0.4 0.3 - 1.2 mg/dL   GFR calc non Af Amer >60 >60 mL/min   GFR calc Af Amer >60 >60 mL/min   Anion gap 10 5 - 15  Protein / creatinine ratio, urine     Status: None   Collection Time: 07/05/20  7:47 PM  Result Value Ref Range   Creatinine, Urine 227.57 mg/dL   Total Protein, Urine 20 mg/dL   Protein Creatinine Ratio 0.09 0.00 - 0.15 mg/mg[Cre]  Urinalysis, Routine w reflex microscopic     Status: Abnormal   Collection Time: 07/05/20  7:47 PM  Result Value Ref Range   Color, Urine YELLOW YELLOW   APPearance CLEAR CLEAR   Specific Gravity, Urine 1.034 (H) 1.005 - 1.030   pH 5.0 5.0 - 8.0   Glucose, UA  NEGATIVE NEGATIVE mg/dL   Hgb urine dipstick NEGATIVE NEGATIVE   Bilirubin Urine NEGATIVE NEGATIVE   Ketones, ur 20 (A) NEGATIVE mg/dL    Protein, ur 30 (A) NEGATIVE mg/dL   Nitrite NEGATIVE NEGATIVE   Leukocytes,Ua NEGATIVE NEGATIVE   RBC / HPF 0-5 0 - 5 RBC/hpf   WBC, UA 0-5 0 - 5 WBC/hpf   Bacteria, UA FEW (A) NONE SEEN   Squamous Epithelial / LPF 0-5 0 - 5   Mucus PRESENT       Imaging:  No results found.  MAU Course/MDM: Orders Placed This Encounter  Procedures  . CBC  . Comprehensive metabolic panel  . Protein / creatinine ratio, urine  . Urinalysis, Routine w reflex microscopic Urine, Clean Catch  . Discharge patient Discharge disposition: 01-Home or Self Care; Discharge patient date: 07/05/2020    No orders of the defined types were placed in this encounter.    NST reviewed Tests in MAU included CBC, CMP, UA, 24h creatinine.   Pt discharge with strict return precautions.    Assessment: Abdominal Pain, nausea, diarrhea - Reassuringly, Pre-E labs WNL. BP stable. Liver function tests WNL.  - Could be related to gallbladder, could consider outpatient U/S.  - Patient has follow up visit tomorrow, stable for d/c.   Blurred Vision- Resolved - Episode of blurred vision in which patient admits to seeing spots. - Unsure whether patient had elevated pressures with this episode but reassuringly, BP's stable while in MAU. Pre-E labs WNL.     Plan: Discharge home Labor and return precautions given Follow up with prenatal visit tomorrow as scheduled.   Follow-up Information    Your doctor Follow up.                Allergies as of 07/05/2020      Reactions   Compazine [prochlorperazine]    hives   Toradol [ketorolac Tromethamine]    CP      Medication List    TAKE these medications   Comfort Fit Maternity Supp Med Misc 1 Device by Does not apply route daily.   cyclobenzaprine 10 MG tablet Commonly known as: FLEXERIL Take 1 tablet (10 mg total) by mouth 3 (three) times daily as needed for muscle spasms.   folic acid 1 MG tablet Commonly known as: FOLVITE Take 1 mg by mouth daily.    lamoTRIgine 25 MG tablet Commonly known as: LAMICTAL Take 25 mg by mouth 2 (two) times daily.        07/05/2020 11:31 PM

## 2020-07-05 NOTE — ED Notes (Signed)
Patient is being discharged from the Urgent Care and sent to the Emergency Department via personal vehicle . Per Dr hagler, patient is in need of higher level of care due to symptoms & needing further evaluation because of pregnancy. Patient is aware and verbalizes understanding of plan of care. There were no vitals filed for this visit.

## 2020-07-05 NOTE — ED Triage Notes (Signed)
Emergency Medicine Provider OB Triage Evaluation Note  Taylor Hodge is a 29 y.o. female, [phone], at [redacted]w[redacted]d gestation who presents to the emergency department with complaints of headache, blurry vision and abdominal pain.  Headache and blurry vision began when she woke up this morning.  Has been having abdominal pain and cramping throughout the day as well.  This is her ninth pregnancy and no history of pre/eclampsia.  Denies any vaginal bleeding, vomiting.  Notes that diarrhea has been an issue with her throughout her entire pregnancy.  Review of  Systems  Positive: Headache, blurry vision, abdominal pain Negative: Numbness in arms or legs, vaginal bleeding  Physical Exam  BP 122/84 (BP Location: Right Arm)   Pulse (!) 104   Temp 98.6 F (37 C)   Resp 18   Ht 5' (1.524 m)   Wt 69.4 kg   SpO2 100%   BMI 29.88 kg/m  General: Awake, no distress  HEENT: Atraumatic  Resp: Normal effort  Cardiac: Normal rate Abd: Nondistended, tender in the epigastric and periumbilical area MSK: Moves all extremities without difficulty Neuro: Speech clear, strength 5/5 in bilateral upper and lower extremities. PERRL.  Normal sensation to light touch of bilateral upper and lower extremities.  Medical Decision Making  Pt evaluated for pregnancy concern and is stable for transfer to MAU. Pt is in agreement with plan for transfer.  6:26 PM Discussed with MAU APP, Megan, who accepts patient in transfer.  She has a positive hCG in our system from last month.  Clinical Impression  No diagnosis found.     Dietrich Pates, PA-C 07/05/20 1828

## 2020-08-02 DIAGNOSIS — O4703 False labor before 37 completed weeks of gestation, third trimester: Secondary | ICD-10-CM | POA: Insufficient documentation

## 2020-08-06 ENCOUNTER — Inpatient Hospital Stay (HOSPITAL_COMMUNITY)
Admission: AD | Admit: 2020-08-06 | Discharge: 2020-08-07 | Disposition: A | Discharge status: Home / Self Care | Payer: Medicaid Other | Attending: Obstetrics & Gynecology | Admitting: Obstetrics & Gynecology

## 2020-08-06 ENCOUNTER — Other Ambulatory Visit: Payer: Self-pay

## 2020-08-06 ENCOUNTER — Encounter (HOSPITAL_COMMUNITY): Payer: Self-pay

## 2020-08-06 DIAGNOSIS — O09899 Supervision of other high risk pregnancies, unspecified trimester: Secondary | ICD-10-CM

## 2020-08-06 DIAGNOSIS — R109 Unspecified abdominal pain: Secondary | ICD-10-CM | POA: Insufficient documentation

## 2020-08-06 DIAGNOSIS — O09213 Supervision of pregnancy with history of pre-term labor, third trimester: Secondary | ICD-10-CM | POA: Diagnosis not present

## 2020-08-06 DIAGNOSIS — G40909 Epilepsy, unspecified, not intractable, without status epilepticus: Secondary | ICD-10-CM | POA: Diagnosis not present

## 2020-08-06 DIAGNOSIS — Z20822 Contact with and (suspected) exposure to covid-19: Secondary | ICD-10-CM | POA: Diagnosis not present

## 2020-08-06 DIAGNOSIS — O26893 Other specified pregnancy related conditions, third trimester: Secondary | ICD-10-CM | POA: Diagnosis not present

## 2020-08-06 DIAGNOSIS — R102 Pelvic and perineal pain: Secondary | ICD-10-CM | POA: Insufficient documentation

## 2020-08-06 DIAGNOSIS — O4703 False labor before 37 completed weeks of gestation, third trimester: Secondary | ICD-10-CM | POA: Insufficient documentation

## 2020-08-06 DIAGNOSIS — R569 Unspecified convulsions: Secondary | ICD-10-CM

## 2020-08-06 DIAGNOSIS — Z3689 Encounter for other specified antenatal screening: Secondary | ICD-10-CM | POA: Diagnosis not present

## 2020-08-06 DIAGNOSIS — M545 Low back pain, unspecified: Secondary | ICD-10-CM | POA: Diagnosis not present

## 2020-08-06 DIAGNOSIS — F419 Anxiety disorder, unspecified: Secondary | ICD-10-CM

## 2020-08-06 DIAGNOSIS — Z98891 History of uterine scar from previous surgery: Secondary | ICD-10-CM

## 2020-08-06 DIAGNOSIS — Z3A28 28 weeks gestation of pregnancy: Secondary | ICD-10-CM | POA: Diagnosis not present

## 2020-08-06 DIAGNOSIS — O26892 Other specified pregnancy related conditions, second trimester: Secondary | ICD-10-CM | POA: Diagnosis not present

## 2020-08-06 DIAGNOSIS — Z87891 Personal history of nicotine dependence: Secondary | ICD-10-CM | POA: Insufficient documentation

## 2020-08-06 DIAGNOSIS — O99353 Diseases of the nervous system complicating pregnancy, third trimester: Secondary | ICD-10-CM | POA: Diagnosis not present

## 2020-08-06 DIAGNOSIS — Z3A29 29 weeks gestation of pregnancy: Secondary | ICD-10-CM | POA: Diagnosis not present

## 2020-08-06 DIAGNOSIS — M542 Cervicalgia: Secondary | ICD-10-CM | POA: Insufficient documentation

## 2020-08-06 DIAGNOSIS — G8929 Other chronic pain: Secondary | ICD-10-CM | POA: Diagnosis not present

## 2020-08-06 DIAGNOSIS — Z79899 Other long term (current) drug therapy: Secondary | ICD-10-CM | POA: Insufficient documentation

## 2020-08-06 DIAGNOSIS — O34219 Maternal care for unspecified type scar from previous cesarean delivery: Secondary | ICD-10-CM

## 2020-08-06 HISTORY — DX: Anemia, unspecified: D64.9

## 2020-08-06 HISTORY — DX: Anxiety disorder, unspecified: F41.9

## 2020-08-06 LAB — RAPID URINE DRUG SCREEN, HOSP PERFORMED
Amphetamines: NOT DETECTED
Barbiturates: NOT DETECTED
Benzodiazepines: NOT DETECTED
Cocaine: NOT DETECTED
Opiates: NOT DETECTED
Tetrahydrocannabinol: NOT DETECTED

## 2020-08-06 LAB — RESPIRATORY PANEL BY RT PCR (FLU A&B, COVID)
Influenza A by PCR: NEGATIVE
Influenza B by PCR: NEGATIVE
SARS Coronavirus 2 by RT PCR: NEGATIVE

## 2020-08-06 LAB — FETAL FIBRONECTIN: Fetal Fibronectin: NEGATIVE

## 2020-08-06 MED ORDER — CYCLOBENZAPRINE HCL 5 MG PO TABS
10.0000 mg | ORAL_TABLET | Freq: Once | ORAL | Status: AC
  Administered 2020-08-06: 10 mg via ORAL
  Filled 2020-08-06: qty 2

## 2020-08-06 MED ORDER — LACTATED RINGERS IV BOLUS
1000.0000 mL | Freq: Once | INTRAVENOUS | Status: AC
  Administered 2020-08-06: 1000 mL via INTRAVENOUS

## 2020-08-06 MED ORDER — NIFEDIPINE 10 MG PO CAPS
10.0000 mg | ORAL_CAPSULE | ORAL | Status: DC | PRN
  Administered 2020-08-06: 10 mg via ORAL
  Filled 2020-08-06: qty 1

## 2020-08-06 NOTE — ED Notes (Signed)
OB nurse at bedside 

## 2020-08-06 NOTE — Progress Notes (Signed)
2022:[facility] called [facility] about pt [name] GA [redacted] weeks. C/o ctx, and mucous discharge. Hx of pre-term labor.  2031[name] at bedside. Pt placed on monitor. 28.6 wks, [name] w/ hx of pre-term labor x4. Gets care at [facility], other [place], resides at [facility]. Makenna shots given periodically due to lack of transportation. Ten minute NST reactive baseline 135 w/ 15x15 accels, no decels noted. UI noted, pt c/o one instance of back pain since getting in the room. Pt also reports not drinking much fluid today. Pain rated 8/10.  2042: Dr. Despina Hidden called and notified of pt [name] at 28&6 GA. Hx of pre-term labor x4. Gets care at [facility].  Ten minute NST reactive baseline 135 w/ 15x15 accels, no decels noted. UI noted, pt c/o one instance of back pain since getting in the room. Pain rated 8/10. Orders received to transport to MAU.   2044: Pt removed from monitor to transport to MAU.

## 2020-08-06 NOTE — ED Provider Notes (Signed)
[facility] EMERGENCY DEPARTMENT Provider Note   CSN: [id number] Arrival date & time: 08/06/20  2015     History No chief complaint on file.   Taylor Hodge is a 29 y.o. female.  HPI Patient is [redacted] weeks pregnant.  She reports this is her ninth pregnancy.  She reports that all of her pregnancies delivered prematurely around 30 weeks.  She reports about 2 hours prior to arrival she passed her mucous plug.  No gush of fluid.  No bleeding.  She reports she then started getting some cramping in her back and lower abdomen.  She has not timed out to see if it is regular.  She denies her pregnancy is been complicated this point.  She however does have history of epilepsy and is on anticonvulsants.    Past Medical History:  Diagnosis Date  . Seizures (HCC)     There are no problems to display for this patient.   Past Surgical History:  Procedure Laterality Date  . EYE SURGERY       OB History    Gravida  9   Para  5   Term  1   Preterm  4   AB  3   Living  5     SAB  2   TAB  1   Ectopic      Multiple      Live Births  5           No family history on file.  Social History   Tobacco Use  . Smoking status: Former Games developer  . Smokeless tobacco: Never Used  Vaping Use  . Vaping Use: Never used  Substance Use Topics  . Alcohol use: Not Currently  . Drug use: Never    Home Medications Prior to Admission medications   Medication Sig Start Date End Date Taking? Authorizing Provider  cyclobenzaprine (FLEXERIL) 10 MG tablet Take 1 tablet (10 mg total) by mouth 3 (three) times daily as needed for muscle spasms. 06/08/20   Aviva Signs, CNM  Elastic Bandages & Supports (COMFORT FIT MATERNITY SUPP MED) MISC 1 Device by Does not apply route daily. 06/08/20   Aviva Signs, CNM  folic acid (FOLVITE) 1 MG tablet Take 1 mg by mouth daily.    [provider]  lamoTRIgine (LAMICTAL) 25 MG tablet Take 25 mg by mouth 2 (two) times  daily.    [provider]    Allergies    Compazine [prochlorperazine] and Toradol [ketorolac tromethamine]  Review of Systems   Review of Systems 10 systems reviewed negative except for as per HPI Physical Exam Updated Vital Signs BP 122/81 (BP Location: Left Arm)   Pulse (!) 106   Temp 98 F (36.7 C) (Oral)   Resp 20   SpO2 100%   Physical Exam Constitutional:      Comments: Patient is alert and appropriate.  She standing in the room pacing slightly.  No respiratory distress.  HENT:     Head: Normocephalic and atraumatic.  Pulmonary:     Effort: Pulmonary effort is normal.  Abdominal:     Comments: Gravid abdomen.  Musculoskeletal:        General: Normal range of motion.  Skin:    General: Skin is warm and dry.  Neurological:     General: No focal deficit present.     Mental Status: She is oriented to person, place, and time.     Coordination: Coordination normal.  Psychiatric:  Mood and Affect: Mood normal.     ED Results / Procedures / Treatments   Labs (all labs ordered are listed, but only abnormal results are displayed) Labs Reviewed - No data to display  EKG None  Radiology No results found.  Procedures Procedures (including critical care time)  Medications Ordered in ED Medications - No data to display  ED Course  I have reviewed the triage vital signs and the nursing notes.  Pertinent labs & imaging results that were available during my care of the patient were reviewed by me and considered in my medical decision making (see chart for details).    MDM Rules/Calculators/A&P                          Patient assessed urgently in the emergency department.  She is [redacted] weeks gestation with abdominal and back cramping.  Patient reports passing a mucous plug.  Rapid Response OB to bedside expeditiously once patient back to her room.  Clinically, patient is stable.  She has clear mental status.  She has no respiratory distress.  She is  nontoxic.  She is up and ambulatory.  No fluid leak.  Review of systems is negative.  She is not experiencing any lower extremity swelling.  She has not been experiencing hypertension during the pregnancy.  No headaches or visual changes.  No shortness of breath or chest pain.  Patient has not had Covid vaccine.  Patient has epilepsy and is compliant with seizure medications.  She denies use of other medications currently.  Disposition will be per rapid OB team for third trimester pregnancy related complaint. Final Clinical Impression(s) / ED Diagnoses Final diagnoses:  Chronic midline low back pain without sciatica  Non-stress test reactive on fetal surveillance    Rx / DC Orders ED Discharge Orders    None       Arby Barrette, MD 08/07/20 1419

## 2020-08-06 NOTE — Discharge Instructions (Signed)

## 2020-08-06 NOTE — ED Triage Notes (Signed)
Pt states is [redacted] weeks pregnant, lost mucous plug, back pain, pelvic pressure, G9P5
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
## 2020-08-06 NOTE — MAU Note (Signed)
Pt brought over from [facility] ED with complaint of contractions at 28.6 weeks. Pt reports back pain. History of preterm labor and preterm delivery

## 2020-08-07 DIAGNOSIS — G8929 Other chronic pain: Secondary | ICD-10-CM | POA: Diagnosis not present

## 2020-08-07 DIAGNOSIS — Z3A28 28 weeks gestation of pregnancy: Secondary | ICD-10-CM | POA: Diagnosis not present

## 2020-08-07 DIAGNOSIS — M545 Low back pain, unspecified: Secondary | ICD-10-CM

## 2020-08-07 DIAGNOSIS — O26892 Other specified pregnancy related conditions, second trimester: Secondary | ICD-10-CM | POA: Diagnosis not present

## 2020-09-06 ENCOUNTER — Inpatient Hospital Stay (HOSPITAL_COMMUNITY)
Admission: AD | Admit: 2020-09-06 | Discharge: 2020-09-07 | Disposition: A | Discharge status: Home / Self Care | Payer: Medicaid Other | Attending: Obstetrics and Gynecology | Admitting: Obstetrics and Gynecology

## 2020-09-06 ENCOUNTER — Encounter (HOSPITAL_COMMUNITY): Payer: Self-pay | Admitting: Obstetrics and Gynecology

## 2020-09-06 DIAGNOSIS — Z3A33 33 weeks gestation of pregnancy: Secondary | ICD-10-CM | POA: Insufficient documentation

## 2020-09-06 DIAGNOSIS — O99613 Diseases of the digestive system complicating pregnancy, third trimester: Secondary | ICD-10-CM | POA: Insufficient documentation

## 2020-09-06 DIAGNOSIS — O99283 Endocrine, nutritional and metabolic diseases complicating pregnancy, third trimester: Secondary | ICD-10-CM | POA: Insufficient documentation

## 2020-09-06 DIAGNOSIS — E860 Dehydration: Secondary | ICD-10-CM | POA: Insufficient documentation

## 2020-09-06 DIAGNOSIS — K529 Noninfective gastroenteritis and colitis, unspecified: Secondary | ICD-10-CM | POA: Diagnosis not present

## 2020-09-06 DIAGNOSIS — Z20822 Contact with and (suspected) exposure to covid-19: Secondary | ICD-10-CM | POA: Insufficient documentation

## 2020-09-06 DIAGNOSIS — Z87891 Personal history of nicotine dependence: Secondary | ICD-10-CM | POA: Diagnosis not present

## 2020-09-06 DIAGNOSIS — R519 Headache, unspecified: Secondary | ICD-10-CM

## 2020-09-06 LAB — CBC WITH DIFFERENTIAL/PLATELET
Abs Immature Granulocytes: 0.08 10*3/uL — ABNORMAL HIGH (ref 0.00–0.07)
Basophils Absolute: 0 10*3/uL (ref 0.0–0.1)
Basophils Relative: 0 %
Eosinophils Absolute: 0 10*3/uL (ref 0.0–0.5)
Eosinophils Relative: 0 %
HCT: 37.8 % (ref 36.0–46.0)
Hemoglobin: 12.5 g/dL (ref 12.0–15.0)
Immature Granulocytes: 1 %
Lymphocytes Relative: 9 %
Lymphs Abs: 1.4 10*3/uL (ref 0.7–4.0)
MCH: 30.6 pg (ref 26.0–34.0)
MCHC: 33.1 g/dL (ref 30.0–36.0)
MCV: 92.4 fL (ref 80.0–100.0)
Monocytes Absolute: 1 10*3/uL (ref 0.1–1.0)
Monocytes Relative: 6 %
Neutro Abs: 12.5 10*3/uL — ABNORMAL HIGH (ref 1.7–7.7)
Neutrophils Relative %: 84 %
Platelets: 181 10*3/uL (ref 150–400)
RBC: 4.09 MIL/uL (ref 3.87–5.11)
RDW: 13.1 % (ref 11.5–15.5)
WBC: 14.9 10*3/uL — ABNORMAL HIGH (ref 4.0–10.5)
nRBC: 0 % (ref 0.0–0.2)

## 2020-09-06 LAB — URINALYSIS, ROUTINE W REFLEX MICROSCOPIC
Bilirubin Urine: NEGATIVE
Glucose, UA: NEGATIVE mg/dL
Hgb urine dipstick: NEGATIVE
Ketones, ur: 5 mg/dL — AB
Nitrite: NEGATIVE
Protein, ur: 30 mg/dL — AB
Specific Gravity, Urine: 1.03 (ref 1.005–1.030)
pH: 5 (ref 5.0–8.0)

## 2020-09-06 LAB — BASIC METABOLIC PANEL
Anion gap: 10 (ref 5–15)
BUN: 10 mg/dL (ref 6–20)
CO2: 19 mmol/L — ABNORMAL LOW (ref 22–32)
Calcium: 8.5 mg/dL — ABNORMAL LOW (ref 8.9–10.3)
Chloride: 109 mmol/L (ref 98–111)
Creatinine, Ser: 0.58 mg/dL (ref 0.44–1.00)
GFR, Estimated: >60 mL/min (ref >60)
Glucose, Bld: 94 mg/dL (ref 70–99)
Potassium: 3.6 mmol/L (ref 3.5–5.1)
Sodium: 138 mmol/L (ref 135–145)

## 2020-09-06 LAB — RESPIRATORY PANEL BY RT PCR (FLU A&B, COVID)
Influenza A by PCR: NEGATIVE
Influenza B by PCR: NEGATIVE
SARS Coronavirus 2 by RT PCR: NEGATIVE

## 2020-09-06 MED ORDER — HYOSCYAMINE SULFATE 0.125 MG SL SUBL
0.1250 mg | SUBLINGUAL_TABLET | Freq: Once | SUBLINGUAL | Status: AC
  Administered 2020-09-06: 0.125 mg via SUBLINGUAL
  Filled 2020-09-06: qty 1

## 2020-09-06 MED ORDER — ACETAMINOPHEN 325 MG PO TABS
650.0000 mg | ORAL_TABLET | Freq: Once | ORAL | Status: AC
  Administered 2020-09-06: 650 mg via ORAL
  Filled 2020-09-06: qty 2

## 2020-09-06 MED ORDER — SODIUM CHLORIDE 0.9 % IV SOLN: Freq: Once | INTRAVENOUS | Status: AC

## 2020-09-06 MED ORDER — SIMETHICONE 80 MG PO CHEW
80.0000 mg | CHEWABLE_TABLET | Freq: Once | ORAL | Status: AC
  Administered 2020-09-06: 80 mg via ORAL
  Filled 2020-09-06: qty 1

## 2020-09-06 MED ORDER — M.V.I. ADULT IV INJ
Freq: Once | INTRAVENOUS | Status: AC
  Filled 2020-09-06: qty 1000

## 2020-09-06 MED ORDER — LACTATED RINGERS IV BOLUS
1000.0000 mL | Freq: Once | INTRAVENOUS | Status: AC
  Administered 2020-09-06: 1000 mL via INTRAVENOUS

## 2020-09-06 MED ORDER — ONDANSETRON HCL 4 MG/2ML IJ SOLN
4.0000 mg | Freq: Once | INTRAMUSCULAR | Status: AC
  Administered 2020-09-06: 4 mg via INTRAVENOUS
  Filled 2020-09-06: qty 2

## 2020-09-06 MED ORDER — DIPHENHYDRAMINE HCL 50 MG/ML IJ SOLN
50.0000 mg | INTRAMUSCULAR | Status: DC
  Filled 2020-09-06: qty 1

## 2020-09-06 NOTE — ED Triage Notes (Signed)
Emergency Medicine Provider OB Triage Evaluation Note  Taylor Hodge is a 29 y.o. female, [phone], at [redacted]w[redacted]d gestation who presents to the emergency department with complaints of back pain x2 hours.  She states she feels that she is having contractions.  She states the pain is constant.  She states the pain radiates to her belly.  She denies her water breaking or vaginal bleeding.  Review of  Systems  Positive: Abdominal pain Negative: Fever, chills, vaginal discharge, vaginal bleeding  Physical Exam  BP (!) 133/96   Pulse (!) 104   Resp 20   SpO2 98%  General: Awake, no distress  HEENT: Atraumatic  Resp: Normal effort  Cardiac: Normal rate Abd: Gravid abdomen, nontender to palpation MSK: Moves all extremities without difficulty Neuro: Speech clear  Medical Decision Making  Pt evaluated for pregnancy concern and is stable for transfer to MAU. Pt is in agreement with plan for transfer.  7:37 PM Discussed with MAU APP, Victorino Dike, who accepts patient in transfer.  Clinical Impression  No diagnosis found.     Shanon Ace, PA-C 09/06/20 1938

## 2020-09-06 NOTE — MAU Note (Signed)
Pt reports nausea today, with loose to watery stools. No known sick contacts. Enteric precautions implemented.

## 2020-09-06 NOTE — MAU Provider Note (Addendum)
History     CSN: [id number]  Arrival date and time: 09/06/20 1925   First Provider Initiated Contact with Patient 09/06/20 2031      Chief Complaint  Patient presents with  . Contractions   Ms. Taylor Hodge is a 29 y.o. year old [phone] female at [redacted]w[redacted]d weeks gestation who presents to MAU reporting nausea and loose, watery stool since 1700 today. She denies any sick contacts. She reports (+) FM. She receives her [facility] with [facility] in [place], [place].   [place] History    Gravida  9   Para  5   Term  1   Preterm  4   AB  3   Living  5     SAB  2   TAB  1   Ectopic      Multiple      Live Births  5           Past Medical History:  Diagnosis Date  . Anemia   . Anxiety   . Seizures (HCC)     Past Surgical History:  Procedure Laterality Date  . CESAREAN SECTION    . EYE SURGERY      History reviewed. No pertinent family history.  Social History   Tobacco Use  . Smoking status: Former Games developer  . Smokeless tobacco: Never Used  Vaping Use  . Vaping Use: Never used  Substance Use Topics  . Alcohol use: Not Currently  . Drug use: Never    Allergies:  Allergies  Allergen Reactions  . Toradol [Ketorolac Tromethamine] Shortness Of Breath  . Compazine [Prochlorperazine] Anxiety    hives    Medications Prior to Admission  Medication Sig Dispense Refill Last Dose  . acetaminophen (TYLENOL) 500 MG tablet Take 1,000 mg by mouth every 6 (six) hours as needed for moderate pain.   09/06/2020 at Unknown time  . cyclobenzaprine (FLEXERIL) 10 MG tablet Take 1 tablet (10 mg total) by mouth 3 (three) times daily as needed for muscle spasms. 30 tablet 2 09/05/2020 at Unknown time  . folic acid (FOLVITE) 1 MG tablet Take 1 mg by mouth daily.   09/06/2020 at Unknown time  . lamoTRIgine (LAMICTAL) 25 MG tablet Take 25 mg by mouth 2 (two) times daily.   09/06/2020 at Unknown time  . Elastic Bandages & Supports (COMFORT FIT MATERNITY SUPP  MED) MISC 1 Device by Does not apply route daily. 1 each 1     Review of Systems  Constitutional: Negative.   HENT: Negative.   Eyes: Negative.   Respiratory: Negative.   Cardiovascular: Negative.   Gastrointestinal: Positive for abdominal pain, diarrhea and nausea.  Endocrine: Negative.   Genitourinary: Negative.   Musculoskeletal: Negative.   Skin: Negative.   Allergic/Immunologic: Negative.   Neurological: Negative.   Hematological: Negative.   Psychiatric/Behavioral: Negative.    Physical Exam   Blood pressure (!) 105/59, pulse (!) 106, temperature 98 F (36.7 C), resp. rate 18, height 5' (1.524 m), weight 72.6 kg, SpO2 100 %.  Physical Exam Constitutional:      Appearance: Normal appearance.  HENT:     Head: Normocephalic and atraumatic.     Nose: Nose normal.  Cardiovascular:     Rate and Rhythm: Tachycardia present.  Pulmonary:     Effort: Pulmonary effort is normal.  Abdominal:     Palpations: Abdomen is soft.  Genitourinary:    Comments: deferred Musculoskeletal:        General: Normal  range of motion.     Cervical back: Normal range of motion.  Skin:    General: Skin is warm and dry.  Neurological:     Mental Status: She is alert and oriented to person, place, and time.  Psychiatric:        Mood and Affect: Mood normal.        Behavior: Behavior normal.        Thought Content: Thought content normal.        Judgment: Judgment normal.    REACTIVE NST - FHR: 140 bpm / moderate variability / accels present / decels absent / TOCO: occ UC's with UI - difficult to distinguish d/t patient rolling from side to side moaning in pain MAU Course  Procedures  MDM LR 1000 mL @ 999 ml/hr Zofran 4 mg IVP Benadryl 50 mg slow IVP CBC w/ Diff BMP  Results for orders placed or performed during the hospital encounter of 09/06/20 (from the past 24 hour(s))  Urinalysis, Routine w reflex microscopic Urine, Clean Catch     Status: Abnormal   Collection Time: 09/06/20   7:53 PM  Result Value Ref Range   Color, Urine YELLOW YELLOW   APPearance HAZY (A) CLEAR   Specific Gravity, Urine 1.030 1.005 - 1.030   pH 5.0 5.0 - 8.0   Glucose, UA NEGATIVE NEGATIVE mg/dL   Hgb urine dipstick NEGATIVE NEGATIVE   Bilirubin Urine NEGATIVE NEGATIVE   Ketones, ur 5 (A) NEGATIVE mg/dL   Protein, ur 30 (A) NEGATIVE mg/dL   Nitrite NEGATIVE NEGATIVE   Leukocytes,Ua SMALL (A) NEGATIVE   RBC / HPF 0-5 0 - 5 RBC/hpf   WBC, UA 0-5 0 - 5 WBC/hpf   Bacteria, UA RARE (A) NONE SEEN   Squamous Epithelial / LPF 0-5 0 - 5   Mucus PRESENT   CBC with Differential/Platelet     Status: Abnormal   Collection Time: 09/06/20  9:12 PM  Result Value Ref Range   WBC 14.9 (H) 4.0 - 10.5 K/uL   RBC 4.09 3.87 - 5.11 MIL/uL   Hemoglobin 12.5 12.0 - 15.0 g/dL   HCT 29.9 36 - 46 %   MCV 92.4 80.0 - 100.0 fL   MCH 30.6 26.0 - 34.0 pg   MCHC 33.1 30.0 - 36.0 g/dL   RDW 24.2 68.3 - 41.9 %   Platelets 181 150 - 400 K/uL   nRBC 0.0 0.0 - 0.2 %   Neutrophils Relative % 84 %   Neutro Abs 12.5 (H) 1.7 - 7.7 K/uL   Lymphocytes Relative 9 %   Lymphs Abs 1.4 0.7 - 4.0 K/uL   Monocytes Relative 6 %   Monocytes Absolute 1.0 0.1 - 1.0 K/uL   Eosinophils Relative 0 %   Eosinophils Absolute 0.0 0.0 - 0.5 K/uL   Basophils Relative 0 %   Basophils Absolute 0.0 0.0 - 0.1 K/uL   Immature Granulocytes 1 %   Abs Immature Granulocytes 0.08 (H) 0.00 - 0.07 K/uL  Basic metabolic panel     Status: Abnormal   Collection Time: 09/06/20  9:12 PM  Result Value Ref Range   Sodium 138 135 - 145 mmol/L   Potassium 3.6 3.5 - 5.1 mmol/L   Chloride 109 98 - 111 mmol/L   CO2 19 (L) 22 - 32 mmol/L   Glucose, Bld 94 70 - 99 mg/dL   BUN 10 6 - 20 mg/dL   Creatinine, Ser 6.22 0.44 - 1.00 mg/dL   Calcium 8.5 (L)  8.9 - 10.3 mg/dL   GFR, Estimated >24 >40 mL/min   Anion gap 10 5 - 15    Report given to and care assumed by Wynelle Bourgeois, CNM @ 2200  Raelyn Mora, MSN, CNM 09/06/2020, 8:32 PM  2305:   Had a  flushing reaction to MVI infusion Stopped infusion and she felt better Will give a liter of NS instead Vomiting has diminished Still some intestinal cramping.   I added some Levsin and Simethicone for intestinal gas We gave 3 more liters of IVF zofran given for nausea and Tylenol for headache  She got good relief from these and requested discharge home  Assessment and Plan   Single IUP at [redacted]w[redacted]d Gastroenteritis Dehydration  Discharge home Rx Fioricet for migraines Rx Levsin for cramps per pt request Rx Zofran for nausea   Advance diet as tolerated Encouraged to return if she develops worsening of symptoms, increase in pain, fever, or other concerning symptoms.    Aviva Signs, CNM

## 2020-09-06 NOTE — MAU Note (Signed)
Pt had reaction to MV bag. Stopped bag; provider came to bedside. Pt symptoms resolved. NS given.

## 2020-09-06 NOTE — ED Triage Notes (Signed)
Pt states she is approx 33 weeks preg, feels like she has had contractions for 2 hours "back to back". Water has not broke, g 9, p5. Marland Kitchen

## 2020-09-06 NOTE — MAU Note (Signed)
Ctxs for couple hours. Denies LOF or VB. 1cm on Monday.

## 2020-09-07 MED ORDER — BUTALBITAL-APAP-CAFFEINE 50-325-40 MG PO TABS: 1.0000 | ORAL_TABLET | Freq: Four times a day (QID) | ORAL | 0 refills | Status: DC | PRN

## 2020-09-07 MED ORDER — HYOSCYAMINE SULFATE 0.125 MG PO TBDP: 0.1250 mg | ORAL_TABLET | Freq: Four times a day (QID) | ORAL | 0 refills | Status: DC | PRN

## 2020-09-07 MED ORDER — ONDANSETRON 4 MG PO TBDP: 4.0000 mg | ORAL_TABLET | Freq: Four times a day (QID) | ORAL | 0 refills | Status: DC | PRN

## 2020-09-07 NOTE — Discharge Instructions (Signed)

## 2020-09-09 ENCOUNTER — Encounter (HOSPITAL_COMMUNITY): Payer: Self-pay | Admitting: *Deleted

## 2020-09-09 ENCOUNTER — Emergency Department (HOSPITAL_COMMUNITY)
Admission: EM | Admit: 2020-09-09 | Discharge: 2020-09-10 | Disposition: A | Discharge status: Home / Self Care | Payer: Medicaid Other | Attending: Emergency Medicine | Admitting: Emergency Medicine

## 2020-09-09 ENCOUNTER — Other Ambulatory Visit: Payer: Self-pay

## 2020-09-09 DIAGNOSIS — Z87891 Personal history of nicotine dependence: Secondary | ICD-10-CM | POA: Insufficient documentation

## 2020-09-09 DIAGNOSIS — K0889 Other specified disorders of teeth and supporting structures: Secondary | ICD-10-CM | POA: Diagnosis not present

## 2020-09-09 DIAGNOSIS — Z3A00 Weeks of gestation of pregnancy not specified: Secondary | ICD-10-CM | POA: Insufficient documentation

## 2020-09-09 DIAGNOSIS — O26899 Other specified pregnancy related conditions, unspecified trimester: Secondary | ICD-10-CM | POA: Insufficient documentation

## 2020-09-09 DIAGNOSIS — Z79899 Other long term (current) drug therapy: Secondary | ICD-10-CM | POA: Insufficient documentation

## 2020-09-09 NOTE — ED Triage Notes (Signed)
The pt is c.o a toothache and swelling to her lt face for several days  She is [redacted] weeks pregnant

## 2020-09-10 ENCOUNTER — Encounter (HOSPITAL_COMMUNITY): Payer: Self-pay | Admitting: Emergency Medicine

## 2020-09-10 MED ORDER — PENICILLIN V POTASSIUM 500 MG PO TABS: 500.0000 mg | ORAL_TABLET | Freq: Four times a day (QID) | ORAL | 0 refills | Status: DC

## 2020-09-10 MED ORDER — PENICILLIN V POTASSIUM 250 MG PO TABS
500.0000 mg | ORAL_TABLET | Freq: Once | ORAL | Status: AC
  Administered 2020-09-10: 500 mg via ORAL
  Filled 2020-09-10: qty 2

## 2020-09-10 MED ORDER — ACETAMINOPHEN 325 MG PO TABS
650.0000 mg | ORAL_TABLET | Freq: Once | ORAL | Status: AC
  Administered 2020-09-10: 650 mg via ORAL
  Filled 2020-09-10: qty 2

## 2020-09-10 MED ORDER — PENICILLIN V POTASSIUM 500 MG PO TABS: 500.0000 mg | ORAL_TABLET | Freq: Four times a day (QID) | ORAL | 0 refills | Status: AC

## 2020-09-10 MED ORDER — LIDOCAINE VISCOUS HCL 2 % MT SOLN
15.0000 mL | Freq: Once | OROMUCOSAL | Status: AC
  Administered 2020-09-10: 15 mL via OROMUCOSAL
  Filled 2020-09-10: qty 15

## 2020-09-10 MED ORDER — CHLORHEXIDINE GLUCONATE 0.12 % MT SOLN: 15.0000 mL | Freq: Two times a day (BID) | OROMUCOSAL | 0 refills | Status: DC

## 2020-09-10 NOTE — ED Provider Notes (Signed)
[facility] EMERGENCY DEPARTMENT Provider Note   CSN: [id number] Arrival date & time: 09/09/20  2154     History Chief Complaint  Patient presents with  . Dental Pain    Taylor Hodge is a 29 y.o. female.  The history is provided by the patient.  Dental Pain Location:  Lower Lower teeth location:  19/LL 1st molar and 18/LL 2nd molar Quality:  Aching Severity:  Severe Onset quality:  Gradual Timing:  Constant Progression:  Unchanged Chronicity:  New Context: poor dentition   Previous work-up:  Dental exam Relieved by:  Nothing Worsened by:  Nothing Ineffective treatments:  None tried Associated symptoms: no congestion, no difficulty swallowing, no drooling, no facial pain, no facial swelling, no fever, no neck swelling, no oral bleeding, no oral lesions and no trismus   Risk factors: no alcohol problem        Past Medical History:  Diagnosis Date  . Anemia   . Anxiety   . Seizures [facility])     Patient Active Problem List   Diagnosis Date Noted  . Hx of cesarean section 08/06/2020  . Anxiety   . Preterm uterine contractions in third trimester, antepartum 08/02/2020  . Maternal obesity affecting pregnancy, antepartum 05/09/2020  . Poor weight gain of pregnancy, third trimester 05/09/2020  . Hepatitis C antibody positive in blood 03/09/2020  . History of preterm delivery, currently pregnant 03/07/2020  . Previous cesarean section complicating pregnancy 03/07/2020  . Elevated BP without diagnosis of hypertension 05/03/2019  . Nicotine dependence, cigarettes, uncomplicated 05/03/2019  . Attention deficit hyperactivity disorder (ADHD), combined type 12/14/2018  . Opioid use disorder, moderate, in early remission, in controlled environment, dependence (HCC) 12/14/2018  . Seizure disorder (HCC) 10/05/2018  . Bipolar affective disorder, currently depressed, moderate (HCC) 09/07/2018  . Generalized anxiety disorder 09/07/2018  . Tic disorder,  unspecified 07/01/2018  . LGSIL on Pap smear of cervix 06/17/2018  . Transaminitis 06/15/2018  . Gastro-esophageal reflux disease without esophagitis 05/06/2018  . 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 History of drug abuse (HCC) 08/31/2015    Past Surgical History:  Procedure Laterality Date  . CESAREAN SECTION    . EYE SURGERY       OB History    Gravida  9   Para  5   Term  1   Preterm  4   AB  3   Living  5     SAB  2   TAB  1   Ectopic      Multiple      Live Births  5           History reviewed. No pertinent family history.  Social History   Tobacco Use  . Smoking status: Former Games developer  . Smokeless tobacco: Never Used  Vaping Use  . Vaping Use: Never used  Substance Use Topics  . Alcohol use: Not Currently  . Drug use: Never    Home Medications Prior to Admission medications   Medication Sig Start Date End Date Taking? Authorizing Provider  acetaminophen (TYLENOL) 500 MG tablet Take 1,000 mg by mouth every 6 (six) hours as needed for moderate pain.    [provider]  butalbital-acetaminophen-caffeine (FIORICET) 50-325-40 MG tablet Take 1-2 tablets by mouth every 6 (six) hours as needed for headache. 09/07/20 09/07/21  Aviva Signs, CNM  cyclobenzaprine (FLEXERIL) 10 MG tablet Take 1 tablet (10 mg total) by mouth 3 (three) times daily as needed for muscle spasms. 06/08/20  Aviva Signs, CNM  Elastic Bandages & Supports (COMFORT FIT MATERNITY SUPP MED) MISC 1 Device by Does not apply route daily. 06/08/20   Aviva Signs, CNM  folic acid (FOLVITE) 1 MG tablet Take 1 mg by mouth daily.    [provider]  hyoscyamine (ANASPAZ) 0.125 MG TBDP disintergrating tablet Place 1 tablet (0.125 mg total) under the tongue every 6 (six) hours as needed for cramping. 09/07/20   Aviva Signs, CNM  lamoTRIgine (LAMICTAL) 25 MG tablet Take 25 mg by mouth 2 (two) times daily.    [provider]  ondansetron  (ZOFRAN ODT) 4 MG disintegrating tablet Take 1 tablet (4 mg total) by mouth every 6 (six) hours as needed for nausea. 09/07/20   Aviva Signs, CNM    Allergies    Fish allergy, Ibuprofen, Mushroom extract complex, Prochlorperazine, Toradol [ketorolac tromethamine], Ketorolac, and Niacin and related  Review of Systems   Review of Systems  Constitutional: Negative for fever.  HENT: Positive for dental problem. Negative for congestion, drooling, facial swelling and mouth sores.   Eyes: Negative for visual disturbance.  Respiratory: Negative for shortness of breath.   Cardiovascular: Negative for chest pain.  Gastrointestinal: Negative for abdominal pain.  Genitourinary: Negative for difficulty urinating.  Musculoskeletal: Negative for arthralgias.  Neurological: Negative for dizziness.  Psychiatric/Behavioral: Negative for agitation.  All other systems reviewed and are negative.   Physical Exam Updated Vital Signs BP 136/89 (BP Location: Left Arm)   Pulse 97   Temp 97.8 F (36.6 C) (Oral)   Resp 18   Ht 5' (1.524 m)   Wt 72.6 kg   SpO2 99%   BMI 31.25 kg/m   Physical Exam Vitals and nursing note reviewed.  Constitutional:      General: She is not in acute distress.    Appearance: Normal appearance.  HENT:     Head: Normocephalic and atraumatic.     Nose: Nose normal.  Eyes:     Conjunctiva/sclera: Conjunctivae normal.     Pupils: Pupils are equal, round, and reactive to light.  Cardiovascular:     Rate and Rhythm: Normal rate and regular rhythm.     Pulses: Normal pulses.     Heart sounds: Normal heart sounds.  Pulmonary:     Effort: Pulmonary effort is normal.     Breath sounds: Normal breath sounds.  Abdominal:     General: Bowel sounds are normal.     Tenderness: There is no abdominal tenderness.  Musculoskeletal:        General: Normal range of motion.     Cervical back: Normal range of motion and neck supple.  Skin:    General: Skin is warm and dry.       Capillary Refill: Capillary refill takes less than 2 seconds.  Neurological:     General: No focal deficit present.     Mental Status: She is alert and oriented to person, place, and time.     Deep Tendon Reflexes: Reflexes normal.  Psychiatric:        Mood and Affect: Mood normal.        Behavior: Behavior normal.     ED Results / Procedures / Treatments   Labs (all labs ordered are listed, but only abnormal results are displayed) Labs Reviewed - No data to display  EKG None  Radiology No results found.  Procedures Procedures (including critical care time)  Medications Ordered in ED Medications  penicillin v potassium (VEETID) tablet  500 mg (500 mg Oral Given 09/10/20 0139)  acetaminophen (TYLENOL) tablet 650 mg (650 mg Oral Given 09/10/20 0140)  lidocaine (XYLOCAINE) 2 % viscous mouth solution 15 mL (15 mLs Mouth/Throat Given 09/10/20 0140)    ED Course  I have reviewed the triage vital signs and the nursing notes.  Pertinent labs & imaging results that were available during my care of the patient were reviewed by me and considered in my medical decision making (see chart for details).   Patient is pregnant with dental pain.  Was just discharged from MAU.  Schedule appointment with dentistry.  Penicillin RX, Tylenol for pain and following up with dentisty.  No signs of Ludwig's angina.     Aryn Safran was evaluated in Emergency Department on 09/10/2020 for the symptoms described in the history of present illness. She was evaluated in the context of the global COVID-19 pandemic, which necessitated consideration that the patient might be at risk for infection with the SARS-CoV-2 virus that causes COVID-19. Institutional protocols and algorithms that pertain to the evaluation of patients at risk for COVID-19 are in a state of rapid change based on information released by regulatory bodies including the CDC and federal and state organizations. These policies and  algorithms were followed during the patient's care in the ED.  Final Clinical Impression(s) / ED Diagnoses Final diagnoses:  Pain, dental   Return for intractable cough, coughing up blood,fevers >100.4 unrelieved by medication, shortness of breath, intractable vomiting, chest pain, shortness of breath, weakness,numbness, changes in speech, facial asymmetry,abdominal pain, passing out,Inability to tolerate liquids or food, cough, altered mental status or any concerns. No signs of systemic illness or infection. The patient is nontoxic-appearing on exam and vital signs are within normal limits.   I have reviewed the triage vital signs and the nursing notes. Pertinent labs &imaging results that were available during my care of the patient were reviewed by me and considered in my medical decision making (see chart for details).After history, exam, and medical workup I feel the patient has beenappropriately medically screened and is safe for discharge home. Pertinent diagnoses were discussed with the patient. Patient was given return precautions.   Barbette Mcglaun, MD 09/10/20 3086

## 2020-09-25 ENCOUNTER — Other Ambulatory Visit: Payer: Self-pay

## 2020-09-25 ENCOUNTER — Inpatient Hospital Stay (HOSPITAL_COMMUNITY)
Admission: AD | Admit: 2020-09-25 | Discharge: 2020-09-25 | Disposition: A | Discharge status: Home / Self Care | Payer: Medicaid Other | Attending: Obstetrics and Gynecology | Admitting: Obstetrics and Gynecology

## 2020-09-25 ENCOUNTER — Encounter (HOSPITAL_COMMUNITY): Payer: Self-pay

## 2020-09-25 DIAGNOSIS — O4703 False labor before 37 completed weeks of gestation, third trimester: Secondary | ICD-10-CM | POA: Diagnosis not present

## 2020-09-25 DIAGNOSIS — O99891 Other specified diseases and conditions complicating pregnancy: Secondary | ICD-10-CM | POA: Diagnosis not present

## 2020-09-25 DIAGNOSIS — Z3A36 36 weeks gestation of pregnancy: Secondary | ICD-10-CM | POA: Diagnosis not present

## 2020-09-25 DIAGNOSIS — O133 Gestational [pregnancy-induced] hypertension without significant proteinuria, third trimester: Secondary | ICD-10-CM | POA: Diagnosis not present

## 2020-09-25 DIAGNOSIS — R030 Elevated blood-pressure reading, without diagnosis of hypertension: Secondary | ICD-10-CM

## 2020-09-25 DIAGNOSIS — O479 False labor, unspecified: Secondary | ICD-10-CM

## 2020-09-25 DIAGNOSIS — Z3493 Encounter for supervision of normal pregnancy, unspecified, third trimester: Secondary | ICD-10-CM

## 2020-09-25 LAB — URINALYSIS, ROUTINE W REFLEX MICROSCOPIC
Bilirubin Urine: NEGATIVE
Glucose, UA: 50 mg/dL — AB
Hgb urine dipstick: NEGATIVE
Ketones, ur: NEGATIVE mg/dL
Leukocytes,Ua: NEGATIVE
Nitrite: NEGATIVE
Protein, ur: NEGATIVE mg/dL
Specific Gravity, Urine: 1.012 (ref 1.005–1.030)
pH: 6 (ref 5.0–8.0)

## 2020-09-25 LAB — COMPREHENSIVE METABOLIC PANEL
ALT: 10 U/L (ref 0–44)
AST: 12 U/L — ABNORMAL LOW (ref 15–41)
Albumin: 2.9 g/dL — ABNORMAL LOW (ref 3.5–5.0)
Alkaline Phosphatase: 103 U/L (ref 38–126)
Anion gap: 11 (ref 5–15)
BUN: 7 mg/dL (ref 6–20)
CO2: 21 mmol/L — ABNORMAL LOW (ref 22–32)
Calcium: 8.8 mg/dL — ABNORMAL LOW (ref 8.9–10.3)
Chloride: 103 mmol/L (ref 98–111)
Creatinine, Ser: 0.51 mg/dL (ref 0.44–1.00)
GFR, Estimated: >60 mL/min (ref >60)
Glucose, Bld: 86 mg/dL (ref 70–99)
Potassium: 3.5 mmol/L (ref 3.5–5.1)
Sodium: 135 mmol/L (ref 135–145)
Total Bilirubin: 0.3 mg/dL (ref 0.3–1.2)
Total Protein: 6 g/dL — ABNORMAL LOW (ref 6.5–8.1)

## 2020-09-25 LAB — PROTEIN / CREATININE RATIO, URINE
Creatinine, Urine: 59.52 mg/dL
Protein Creatinine Ratio: 0.15 mg/mg{Cre} (ref 0.00–0.15)
Total Protein, Urine: 9 mg/dL

## 2020-09-25 LAB — CBC
HCT: 31.7 % — ABNORMAL LOW (ref 36.0–46.0)
Hemoglobin: 10.9 g/dL — ABNORMAL LOW (ref 12.0–15.0)
MCH: 30 pg (ref 26.0–34.0)
MCHC: 34.4 g/dL (ref 30.0–36.0)
MCV: 87.3 fL (ref 80.0–100.0)
Platelets: 173 10*3/uL (ref 150–400)
RBC: 3.63 MIL/uL — ABNORMAL LOW (ref 3.87–5.11)
RDW: 12.6 % (ref 11.5–15.5)
WBC: 12.4 10*3/uL — ABNORMAL HIGH (ref 4.0–10.5)
nRBC: 0 % (ref 0.0–0.2)

## 2020-09-25 LAB — TYPE AND SCREEN
ABO/RH(D): O POS
Antibody Screen: NEGATIVE

## 2020-09-25 NOTE — MAU Provider Note (Signed)
Chief Complaint  Patient presents with  . Laboring  . Contractions     First Provider Initiated Contact with Patient 09/25/20 2153      S: Taylor Hodge  is a 29 y.o. y.o. year old [phone] female at [redacted]w[redacted]d weeks gestation who presents to MAU with contractions. While in MAU patient has elevated blood pressures. Denies hx of hypertension. Current blood pressure medication: none.   Associated symptoms: denies headache, denies vision changes, denies epigastric pain Contractions: 5-10 minutes since 1pm  Vaginal bleeding: none Fetal movement: present   Patient receives prenatal care at [facility], patient reports that she plans to deliver at [facility] and if she is in labor she plans to leave and go to [place] for delivery. Educated about the risks of leaving AMA and in labor as well, patient reports that she will consider whether she stays based on next cervical examination.   O:  Patient Vitals for the past 24 hrs:  BP Temp Temp src Pulse Resp SpO2 Height Weight  09/25/20 2331 123/81 -- -- (!) 115 -- -- -- --  09/25/20 2330 -- -- -- -- -- 97 % -- --  09/25/20 2317 120/76 -- -- (!) 104 -- -- -- --  09/25/20 2311 (!) 116/99 -- -- (!) 106 -- -- -- --  09/25/20 2301 112/86 -- -- (!) 111 -- -- -- --  09/25/20 2252 124/82 -- -- (!) 110 -- -- -- --  09/25/20 2241 128/81 -- -- (!) 110 -- -- -- --  09/25/20 2240 -- -- -- -- -- 99 % -- --  09/25/20 2231 122/77 -- -- 99 -- -- -- --  09/25/20 2221 121/82 -- -- (!) 109 -- -- -- --  09/25/20 2211 127/79 -- -- (!) 112 -- -- -- --  09/25/20 2201 130/84 -- -- (!) 115 -- -- -- --  09/25/20 2151 131/83 -- -- (!) 121 -- -- -- --  09/25/20 2150 -- -- -- -- -- 98 % -- --  09/25/20 2145 121/79 -- -- (!) 112 16 98 % -- --  09/25/20 2127 (!) 148/111 98.7 F (37.1 C) Oral (!) 120 -- 100 % 5' (1.524 m) 75.4 kg  09/25/20 2059 127/87 97.8 F (36.6 C) Oral (!) 121 (!) 22 99 % -- --   General: NAD Heart: Regular rate Lungs: Normal rate and effort Abd: Soft, NT,  Gravid, S=D Extremities: no Pedal edema Neuro: 2+ deep tendon reflexes, No clonus  Dilation: 3.5 Effacement (%): 20 Cervical Position: [place]: -3 Presentation: Vertex Exam by:: Janae Bridgeman RN  Recheck cervix and no cervical change after 1 hour   Fetal monitoring:  NST:155/moderate/+accels/ no decelerations  Toco:irregular mild contractions   Results for orders placed or performed during the hospital encounter of 09/25/20 (from the past 24 hour(s))  Protein / creatinine ratio, urine     Status: None   Collection Time: 09/25/20  9:56 PM  Result Value Ref Range   Creatinine, Urine 59.52 mg/dL   Total Protein, Urine 9 mg/dL   Protein Creatinine Ratio 0.15 0.00 - 0.15 mg/mg[Cre]  Urinalysis, Routine w reflex microscopic Urine, Clean Catch     Status: Abnormal   Collection Time: 09/25/20  9:56 PM  Result Value Ref Range   Color, Urine STRAW (A) YELLOW   APPearance CLEAR CLEAR   Specific Gravity, Urine 1.012 1.005 - 1.030   pH 6.0 5.0 - 8.0   Glucose, UA 50 (A) NEGATIVE mg/dL   Hgb urine dipstick NEGATIVE NEGATIVE  Bilirubin Urine NEGATIVE NEGATIVE   Ketones, ur NEGATIVE NEGATIVE mg/dL   Protein, ur NEGATIVE NEGATIVE mg/dL   Nitrite NEGATIVE NEGATIVE   Leukocytes,Ua NEGATIVE NEGATIVE  CBC     Status: Abnormal   Collection Time: 09/25/20 10:25 PM  Result Value Ref Range   WBC 12.4 (H) 4.0 - 10.5 K/uL   RBC 3.63 (L) 3.87 - 5.11 MIL/uL   Hemoglobin 10.9 (L) 12.0 - 15.0 g/dL   HCT 82.9 (L) 36 - 46 %   MCV 87.3 80.0 - 100.0 fL   MCH 30.0 26.0 - 34.0 pg   MCHC 34.4 30.0 - 36.0 g/dL   RDW 56.2 13.0 - 86.5 %   Platelets 173 150 - 400 K/uL   nRBC 0.0 0.0 - 0.2 %  Comprehensive metabolic panel     Status: Abnormal   Collection Time: 09/25/20 10:25 PM  Result Value Ref Range   Sodium 135 135 - 145 mmol/L   Potassium 3.5 3.5 - 5.1 mmol/L   Chloride 103 98 - 111 mmol/L   CO2 21 (L) 22 - 32 mmol/L   Glucose, Bld 86 70 - 99 mg/dL   BUN 7 6 - 20 mg/dL   Creatinine, Ser 7.84  0.44 - 1.00 mg/dL   Calcium 8.8 (L) 8.9 - 10.3 mg/dL   Total Protein 6.0 (L) 6.5 - 8.1 g/dL   Albumin 2.9 (L) 3.5 - 5.0 g/dL   AST 12 (L) 15 - 41 U/L   ALT 10 0 - 44 U/L   Alkaline Phosphatase 103 38 - 126 U/L   Total Bilirubin 0.3 0.3 - 1.2 mg/dL   GFR, Estimated >69 >62 mL/min   Anion gap 11 5 - 15  Type and screen [facility]     Status: None   Collection Time: 09/25/20 10:25 PM  Result Value Ref Range   ABO/RH(D) O POS    Antibody Screen NEG    Sample Expiration      09/28/2020,2359 Performed at [facility], [address], [place], [place] [address]     A: [redacted]w[redacted]d week IUP 1. Labor, false (Braxton-Hicks), antepartum   2. Third trimester pregnancy   3. [redacted] weeks gestation of pregnancy   4. Elevated BP without diagnosis of hypertension   FHR reactive  P: Discharge home in stable condition  Preeclampsia precautions. Follow-up for blood pressure check in 1-2 days at your doctor's office sooner as needed if symptoms worsen- [facility] Return to maternity admissions as needed in emergencies  Sharyon Cable, CNM 09/25/2020 11:43 PM

## 2020-09-25 NOTE — MAU Note (Addendum)
Pt having ctx since 1pm. Unsure how far apart they are. Feeling most in her back. Some are 10 minutes apart. Rates 9/10. No LOF no bleeding. Feeling baby move. Pelvic pressure. Gets care in [place] at [facility].  2cm at last appointment

## 2020-09-25 NOTE — ED Triage Notes (Signed)
Emergency Medicine Provider OB Triage Evaluation Note  Taylor Hodge is a 29 y.o. female, G 10 P6, at [redacted]w[redacted]d gestation who presents to the emergency department with complaints of contractions.  She states that many of her previous deliveries have been vaginal, she has not had any loss of fluids, is having back pain.  She states that last time she was in labor for 3 days.  She gets her care at [facility].  She states that she is 5 to 10 minutes apart on her contractions, does not know how long they are lasting for.  She denies feeling the need to push or have a bowel movement.  When I asked her based on her previous experiences with L&D if she feels like she is going to deliver in the next hour she says no.    Review of  Systems  Positive: Contractions 5 to 10 minutes apart Negative: No loss of fluids, fevers, cough, or shortness of breath.  Physical Exam  BP 127/87 (BP Location: Right Arm)   Pulse (!) 121   Temp 97.8 F (36.6 C) (Oral)   Resp (!) 22   SpO2 99%  General: Awake, no distress, does not appear to be having a contraction while I am in the room.  HEENT: Atraumatic  Resp: Normal effort  Cardiac: Tachycardic Abd: Nondistended, nontender, obviously gravid MSK: Moves all extremities without difficulty Neuro: Speech clear  Medical Decision Making  Pt evaluated for pregnancy concern and is stable for transfer to MAU. Pt is in agreement with plan for transfer.  I was in the room with patient for over 5 minutes and she did not have a contraction, appeared comfortable, was able to speak in full sentences without difficulty.  9:13 PM Discussed with MAU APP, Rosaletta, who accepts patient in transfer.  Clinical Impression   1. Third trimester pregnancy        Cristina Gong, Cordelia Poche 09/25/20 2113

## 2020-09-25 NOTE — ED Triage Notes (Addendum)
Pt is 36weeks, G9P5, contractions are 5-10 minutes apart, no rupture of membranes, back pain

## 2020-09-26 LAB — RPR: RPR Ser Ql: NONREACTIVE

## 2020-10-30 ENCOUNTER — Encounter (HOSPITAL_COMMUNITY): Payer: Self-pay

## 2020-10-30 ENCOUNTER — Other Ambulatory Visit: Payer: Self-pay

## 2020-10-30 ENCOUNTER — Ambulatory Visit (HOSPITAL_COMMUNITY)
Admission: EM | Admit: 2020-10-30 | Discharge: 2020-10-30 | Disposition: A | Discharge status: Home / Self Care | Payer: Medicaid Other | Attending: Student | Admitting: Student

## 2020-10-30 DIAGNOSIS — U071 COVID-19: Secondary | ICD-10-CM | POA: Diagnosis not present

## 2020-10-30 DIAGNOSIS — Z8719 Personal history of other diseases of the digestive system: Secondary | ICD-10-CM | POA: Diagnosis present

## 2020-10-30 DIAGNOSIS — R110 Nausea: Secondary | ICD-10-CM | POA: Diagnosis present

## 2020-10-30 DIAGNOSIS — J069 Acute upper respiratory infection, unspecified: Secondary | ICD-10-CM | POA: Diagnosis not present

## 2020-10-30 LAB — POCT URINALYSIS DIPSTICK, ED / UC
Bilirubin Urine: NEGATIVE
Glucose, UA: NEGATIVE mg/dL
Ketones, ur: NEGATIVE mg/dL
Leukocytes,Ua: NEGATIVE
Nitrite: NEGATIVE
Protein, ur: 30 mg/dL — AB
Specific Gravity, Urine: >=1.03 (ref 1.005–1.030)
Urobilinogen, UA: 0.2 mg/dL (ref 0.0–1.0)
pH: 5.5 (ref 5.0–8.0)

## 2020-10-30 LAB — HEPATIC FUNCTION PANEL
ALT: 13 U/L (ref 0–44)
AST: 18 U/L (ref 15–41)
Albumin: 3.8 g/dL (ref 3.5–5.0)
Alkaline Phosphatase: 105 U/L (ref 38–126)
Bilirubin, Direct: <0.1 mg/dL (ref 0.0–0.2)
Total Bilirubin: 0.4 mg/dL (ref 0.3–1.2)
Total Protein: 7.2 g/dL (ref 6.5–8.1)

## 2020-10-30 LAB — SARS CORONAVIRUS 2 (TAT 6-24 HRS): SARS Coronavirus 2: POSITIVE — AB

## 2020-10-30 LAB — LIPASE, BLOOD: Lipase: 28 U/L (ref 11–51)

## 2020-10-30 LAB — AMYLASE: Amylase: 53 U/L (ref 28–100)

## 2020-10-30 LAB — POC URINE PREG, ED: Preg Test, Ur: NEGATIVE

## 2020-10-30 MED ORDER — ONDANSETRON HCL 8 MG PO TABS: 8.0000 mg | ORAL_TABLET | Freq: Three times a day (TID) | ORAL | 0 refills | Status: DC | PRN

## 2020-10-30 NOTE — ED Provider Notes (Signed)
[facility]    CSN: [id number] Arrival date & time: 10/30/20  1025      History   Chief Complaint Chief Complaint  Patient presents with  . Abdominal Pain  . Cough  . Nasal Congestion    HPI Taylor Hodge is a 30 y.o. female Presenting for URI symptoms x12 hours and abd pain / urinary frequency x1 day. History of anemia, anxiety, seizues, cholelithiasis. Pt states she just moved to [place] from [place], where she was followed by GI for this. States they considered removing gallbladder but haven't yet as she was pregnant. -Endorses cough, nasal congestion x12 hours. Denies fevers/chills, n/v/d, shortness of breath, chest pain,  facial pain, teeth pain, headaches, sore throat, loss of taste/smell, swollen lymph nodes, ear pain.  Denies chest pain, shortness of breath, confusion, high fevers.  -Endorses generalized abd pain worse in RUQ. Diarrhea for few days. Some nausea but no vomiting. Denies hematuria, frequency, dysuria, urgency, back pain, v/d fevers/chills, abdnormal vaginal discharge.  -She gave birth 3 weeks ago. Not breastfeeding. States she is healing well without issues- no discharge, no vaginal pain, etc.   HPI  Past Medical History:  Diagnosis Date  . Anemia   . Anxiety   . Seizures [facility](HCC)     Patient Active Problem List   Diagnosis Date Noted  . Hx of cesarean section 08/06/2020  . Anxiety   . Preterm uterine contractions in third trimester, antepartum 08/02/2020  . Maternal obesity affecting pregnancy, antepartum 05/09/2020  . Poor weight gain of pregnancy, third trimester 05/09/2020  . Hepatitis C antibody positive in blood 03/09/2020  . History of preterm delivery, currently pregnant 03/07/2020  . Previous cesarean section complicating pregnancy 03/07/2020  . Elevated BP without diagnosis of hypertension 05/03/2019  . Nicotine dependence, cigarettes, uncomplicated 05/03/2019  . Attention deficit hyperactivity disorder (ADHD), combined type  12/14/2018  . Opioid use disorder, moderate, in early remission, in controlled environment, dependence (HCC) 12/14/2018  . Seizure disorder (HCC) 10/05/2018  . Bipolar affective disorder, currently depressed, moderate (HCC) 09/07/2018  . Generalized anxiety disorder 09/07/2018  . Tic disorder, unspecified 07/01/2018  . LGSIL on Pap smear of cervix 06/17/2018  . Transaminitis 06/15/2018  . Gastro-esophageal reflux disease without esophagitis 05/06/2018  . 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 History of drug abuse (HCC) 08/31/2015    Past Surgical History:  Procedure Laterality Date  . CESAREAN SECTION    . EYE SURGERY      OB History    Gravida  9   Para  5   Term  1   Preterm  4   AB  3   Living  5     SAB  2   IAB  1   Ectopic      Multiple      Live Births  5            Home Medications    Prior to Admission medications   Medication Sig Start Date End Date Taking? Authorizing Provider  ondansetron (ZOFRAN) 8 MG tablet Take 1 tablet (8 mg total) by mouth every 8 (eight) hours as needed for nausea or vomiting. 10/30/20  Yes Rhys MartiniGraham, Arienne Gartin E, PA-C  acetaminophen (TYLENOL) 500 MG tablet Take 1,000 mg by mouth every 6 (six) hours as needed for moderate pain.    [provider]  butalbital-acetaminophen-caffeine (FIORICET) 50-325-40 MG tablet Take 1-2 tablets by mouth every 6 (six) hours as needed for headache. 09/07/20 09/07/21  Aviva SignsWilliams, Marie L,  CNM  chlorhexidine (PERIDEX) 0.12 % solution Use as directed 15 mLs in the mouth or throat 2 (two) times daily. 09/10/20   Palumbo, April, MD  cyclobenzaprine (FLEXERIL) 10 MG tablet Take 1 tablet (10 mg total) by mouth 3 (three) times daily as needed for muscle spasms. 06/08/20   Aviva Signs, CNM  Elastic Bandages & Supports (COMFORT FIT MATERNITY SUPP MED) MISC 1 Device by Does not apply route daily. 06/08/20   Aviva Signs, CNM  folic acid (FOLVITE) 1 MG tablet Take 1 mg by mouth daily.     [provider]  hyoscyamine (ANASPAZ) 0.125 MG TBDP disintergrating tablet Place 1 tablet (0.125 mg total) under the tongue every 6 (six) hours as needed for cramping. 09/07/20   Aviva Signs, CNM  lamoTRIgine (LAMICTAL) 25 MG tablet Take 25 mg by mouth 2 (two) times daily.    [provider]  ondansetron (ZOFRAN ODT) 4 MG disintegrating tablet Take 1 tablet (4 mg total) by mouth every 6 (six) hours as needed for nausea. 09/07/20   Aviva Signs, CNM    Family History History reviewed. No pertinent family history.  Social History Social History   Tobacco Use  . Smoking status: Former Games developer  . Smokeless tobacco: Never Used  Vaping Use  . Vaping Use: Never used  Substance Use Topics  . Alcohol use: Not Currently  . Drug use: Not Currently     Allergies   Fish allergy, Ibuprofen, Mushroom extract complex, Prochlorperazine, Toradol [ketorolac tromethamine], Ketorolac, and Niacin and related   Review of Systems Review of Systems  Constitutional: Negative for appetite change, chills, diaphoresis and fever.  HENT: Positive for congestion. Negative for ear pain, rhinorrhea, sinus pressure, sinus pain and sore throat.   Eyes: Negative for redness and visual disturbance.  Respiratory: Positive for cough. Negative for chest tightness, shortness of breath and wheezing.   Cardiovascular: Negative for chest pain and palpitations.  Gastrointestinal: Positive for abdominal pain. Negative for blood in stool, constipation, diarrhea, nausea and vomiting.  Genitourinary: Positive for frequency. Negative for decreased urine volume, difficulty urinating, dysuria, flank pain, genital sores, hematuria and urgency.  Musculoskeletal: Negative for back pain and myalgias.  Neurological: Negative for dizziness, weakness, light-headedness and headaches.  Psychiatric/Behavioral: Negative for confusion.  All other systems reviewed and are negative.    Physical Exam Triage  Vital Signs ED Triage Vitals  Enc Vitals Group     BP 10/30/20 1312 113/79     Pulse Rate 10/30/20 1312 92     Resp 10/30/20 1312 18     Temp 10/30/20 1312 98.1 F (36.7 C)     Temp Source 10/30/20 1312 Oral     SpO2 10/30/20 1312 97 %     Weight --      Height --      Head Circumference --      Peak Flow --      Pain Score 10/30/20 1311 8     Pain Loc --      Pain Edu? --      Excl. in GC? --    No data found.  Updated Vital Signs BP 113/79 (BP Location: Right Arm)   Pulse 92   Temp 98.1 F (36.7 C) (Oral)   Resp 18   SpO2 97%   Breastfeeding No   Visual Acuity Right Eye Distance:   Left Eye Distance:   Bilateral Distance:    Right Eye Near:   Left Eye Near:  Bilateral Near:     Physical Exam Vitals reviewed.  Constitutional:      General: She is not in acute distress.    Appearance: Normal appearance. She is not ill-appearing.  HENT:     Head: Normocephalic and atraumatic.     Right Ear: Hearing, tympanic membrane, ear canal and external ear normal. No swelling or tenderness. There is no impacted cerumen. No mastoid tenderness. Tympanic membrane is not perforated, erythematous, retracted or bulging.     Left Ear: Hearing, tympanic membrane, ear canal and external ear normal. No swelling or tenderness. There is no impacted cerumen. No mastoid tenderness. Tympanic membrane is not perforated, erythematous, retracted or bulging.     Nose:     Right Sinus: No maxillary sinus tenderness or frontal sinus tenderness.     Left Sinus: No maxillary sinus tenderness or frontal sinus tenderness.     Mouth/Throat:     Mouth: Mucous membranes are moist.     Pharynx: Uvula midline. No oropharyngeal exudate or posterior oropharyngeal erythema.     Tonsils: No tonsillar exudate.  Cardiovascular:     Rate and Rhythm: Normal rate and regular rhythm.     Heart sounds: Normal heart sounds.  Pulmonary:     Effort: Pulmonary effort is normal.     Breath sounds: Normal breath  sounds and air entry. No wheezing, rhonchi or rales.  Chest:     Chest wall: No tenderness.  Abdominal:     General: Abdomen is flat. Bowel sounds are normal. There is no distension.     Palpations: Abdomen is soft. There is no mass.     Tenderness: There is generalized abdominal tenderness. There is no right CVA tenderness, left CVA tenderness, guarding or rebound. Positive signs include Murphy's sign. Negative signs include Rovsing's sign and McBurney's sign.     Comments: RUQ tenderness with deep palpation   Lymphadenopathy:     Cervical: No cervical adenopathy.  Neurological:     General: No focal deficit present.     Mental Status: She is alert and oriented to person, place, and time. Mental status is at baseline.  Psychiatric:        Attention and Perception: Attention and perception normal.        Mood and Affect: Mood and affect normal.        Behavior: Behavior normal. Behavior is cooperative.        Thought Content: Thought content normal.        Judgment: Judgment normal.      UC Treatments / Results  Labs (all labs ordered are listed, but only abnormal results are displayed) Labs Reviewed  POCT URINALYSIS DIPSTICK, ED / UC - Abnormal; Notable for the following components:      Result Value   Hgb urine dipstick SMALL (*)    Protein, ur 30 (*)    All other components within normal limits  SARS CORONAVIRUS 2 (TAT 6-24 HRS)  URINE CULTURE  AMYLASE  LIPASE, BLOOD  HEPATIC FUNCTION PANEL  POC URINE PREG, ED    EKG   Radiology No results found.  Procedures Procedures (including critical care time)  Medications Ordered in UC Medications - No data to display  Initial Impression / Assessment and Plan / UC Course  I have reviewed the triage vital signs and the nursing notes.  Pertinent labs & imaging results that were available during my care of the patient were reviewed by me and considered in my medical decision making (see chart  for details).     UA  today with small blood, nitrite and leuk negative. Culture sent. Urine pregnancy negative.  Pt with history of cholelithiasis. LFTs and amylase/lipase as below. We'll call you if the results of your labs are abnormal. If they are, please follow-up with a GI doctor (information provided) or your PCP for a referral to a GI doctor. She understands -If you experience severe/worsening abdominal pain, fevers/chills, etc- head to ED. She verbalizes understanding and agreement. Pt strongly prefers to workup her abd discomfort outpatient;she is hemodynamically stable with mild RUQ pain at this time. I am in agreement that we can proceed with out patient labwork today, but she must head to ED if worsening of pain, fevers/chills, etc.   Covid and influenza tests sent today.  Isolation precautions per CDC guidelines until negative result. Symptomatic relief with OTC Mucinex, Nyquil, etc. Return precautions- new/worsening fevers/chills, shortness of breath, chest pain, abd pain, etc.   06/2020 OB-GYN note and labs reviewed.   Final Clinical Impressions(s) / UC Diagnoses   Final diagnoses:  Viral upper respiratory tract infection  History of cholelithiasis  Nausea without vomiting     Discharge Instructions     -We'll call you if the results of your labs are abnormal. If they are, please follow-up with a GI doctor (information provided below) or your PCP for a referral to a GI doctor. -Zofran for nausea.  -For fevers/chills, body aches, headaches- use Tylenol and Ibuprofen. You can alternate these for maximum effect. Use up to 3000mg  Tylenol daily and 3200mg  Ibuprofen daily. Make sure to take ibuprofen with food. Check the bottle of ibuprofen/tylenol for specific dosage instructions.  -If you experience severe/worsening abdominal pain, fevers/chills, etc- head to ED.   We are currently awaiting result of your PCR covid-19 test. This typically comes back in 1-2 days. We'll call you if the result is  positive. Otherwise, the result will be sent electronically to your MyChart. You can also call this clinic and ask for your result via telephone.   Please isolate at home while awaiting these results. If your test is positive for Covid-19, continue to isolate at home for 5 days if you have mild symptoms, or a total of 10 days from symptom onset if you have more severe symptoms. If you quarantine for a shorter period of time (i.e. 5 days), make sure to wear a mask until day 10 of symptoms. Treat your symptoms at home with OTC remedies like tylenol/ibuprofen, mucinex, nyquil, etc. Seek medical attention if you develop high fevers, chest pain, shortness of breath, ear pain, facial pain, etc. Make sure to get up and move around every 2-3 hours while convalescing to help prevent blood clots. Drink plenty of fluids, and rest as much as possible.     ED Prescriptions    Medication Sig Dispense Auth. Provider   ondansetron (ZOFRAN) 8 MG tablet Take 1 tablet (8 mg total) by mouth every 8 (eight) hours as needed for nausea or vomiting. 20 tablet , PA-C     PDMP not reviewed this encounter.   , PA-C 10/30/20 1505

## 2020-10-30 NOTE — ED Triage Notes (Signed)
Pt presents with abdominal pain and increased urinary frequency x 1 day; cough and nasal congestion started today. Denies fever, sob.

## 2020-10-30 NOTE — Discharge Instructions (Addendum)
-  We'll call you if the results of your labs are abnormal. If they are, please follow-up with a GI doctor (information provided below) or your PCP for a referral to a GI doctor. -Zofran for nausea.  -For fevers/chills, body aches, headaches- use Tylenol and Ibuprofen. You can alternate these for maximum effect. Use up to 3000mg  Tylenol daily and 3200mg  Ibuprofen daily. Make sure to take ibuprofen with food. Check the bottle of ibuprofen/tylenol for specific dosage instructions.  -If you experience severe/worsening abdominal pain, fevers/chills, etc- head to ED.   We are currently awaiting result of your PCR covid-19 test. This typically comes back in 1-2 days. We'll call you if the result is positive. Otherwise, the result will be sent electronically to your MyChart. You can also call this clinic and ask for your result via telephone.   Please isolate at home while awaiting these results. If your test is positive for Covid-19, continue to isolate at home for 5 days if you have mild symptoms, or a total of 10 days from symptom onset if you have more severe symptoms. If you quarantine for a shorter period of time (i.e. 5 days), make sure to wear a mask until day 10 of symptoms. Treat your symptoms at home with OTC remedies like tylenol/ibuprofen, mucinex, nyquil, etc. Seek medical attention if you develop high fevers, chest pain, shortness of breath, ear pain, facial pain, etc. Make sure to get up and move around every 2-3 hours while convalescing to help prevent blood clots. Drink plenty of fluids, and rest as much as possible.

## 2020-10-31 LAB — URINE CULTURE: Culture: <10000 — AB

## 2020-11-08 ENCOUNTER — Other Ambulatory Visit: Payer: Self-pay

## 2020-11-08 ENCOUNTER — Emergency Department (HOSPITAL_COMMUNITY)
Admission: EM | Admit: 2020-11-08 | Discharge: 2020-11-08 | Disposition: A | Discharge status: Home / Self Care | Payer: Medicaid Other | Attending: Emergency Medicine | Admitting: Emergency Medicine

## 2020-11-08 ENCOUNTER — Encounter (HOSPITAL_COMMUNITY): Payer: Self-pay | Admitting: Emergency Medicine

## 2020-11-08 DIAGNOSIS — U071 COVID-19: Secondary | ICD-10-CM | POA: Diagnosis not present

## 2020-11-08 DIAGNOSIS — Z5901 Sheltered homelessness: Secondary | ICD-10-CM | POA: Insufficient documentation

## 2020-11-08 DIAGNOSIS — A09 Infectious gastroenteritis and colitis, unspecified: Secondary | ICD-10-CM | POA: Diagnosis not present

## 2020-11-08 DIAGNOSIS — Z87891 Personal history of nicotine dependence: Secondary | ICD-10-CM | POA: Diagnosis not present

## 2020-11-08 DIAGNOSIS — R519 Headache, unspecified: Secondary | ICD-10-CM | POA: Diagnosis present

## 2020-11-08 LAB — SARS CORONAVIRUS 2 (TAT 6-24 HRS): SARS Coronavirus 2: POSITIVE — AB

## 2020-11-08 NOTE — Discharge Instructions (Addendum)
The Covid test is pending at time of discharge.  Instructions on how to follow this up on my chart are on your discharge paperwork, you can also call the department if you are having trouble finding these results.  If he/she is Covid positive he/she will need to be quarantine for total 5 days since the onset of symptoms +24 hours of no fever and resolving symptoms, additionally he/she needs to wear a mask near all others for 5 more days. If he/she is not Covid positive he/she is able to go back to normal day-to-day routine as long as he/she is not having fevers and it has been 24 hours since his/her last fever.  Please find another community resource for need of repeat COVID testing, the emergency department has provided a flyer you can scan and find local resources for future testing.

## 2020-11-08 NOTE — ED Notes (Signed)
Discharge instructions reviewed. Given covid testing site and instructed to call medical records when she needs proof of covid test.

## 2020-11-08 NOTE — ED Triage Notes (Signed)
Pt comes in for COVID test due to shelter needing test. Pt says she tested positive last Monday but still needs test. Reports headache and diarrhea.

## 2020-11-08 NOTE — ED Provider Notes (Signed)
[facility] EMERGENCY DEPARTMENT Provider Note   CSN: [id number] Arrival date & time: 11/08/20  1120     History Chief Complaint  Patient presents with  . Covid Exposure    Taylor Hodge is a 30 y.o. female.  Patient is known to be COVID-positive.  Was sent here from her homeless shelter because she needs negative testing, she needs testing done this week and likely subsequent weeks per the mandate of her living facility.  She continues to have intermittent diarrhea and headache and fatigue.  But she has no increased work of breathing, she is tolerating p.o.        Past Medical History:  Diagnosis Date  . Anemia   . Anxiety   . Seizures [facility])     Patient Active Problem List   Diagnosis Date Noted  . Hx of cesarean section 08/06/2020  . Anxiety   . Preterm uterine contractions in third trimester, antepartum 08/02/2020  . Maternal obesity affecting pregnancy, antepartum 05/09/2020  . Poor weight gain of pregnancy, third trimester 05/09/2020  . Hepatitis C antibody positive in blood 03/09/2020  . History of preterm delivery, currently pregnant 03/07/2020  . Previous cesarean section complicating pregnancy 03/07/2020  . Elevated BP without diagnosis of hypertension 05/03/2019  . Nicotine dependence, cigarettes, uncomplicated 05/03/2019  . Attention deficit hyperactivity disorder (ADHD), combined type 12/14/2018  . Opioid use disorder, moderate, in early remission, in controlled environment, dependence (HCC) 12/14/2018  . Seizure disorder (HCC) 10/05/2018  . Bipolar affective disorder, currently depressed, moderate (HCC) 09/07/2018  . Generalized anxiety disorder 09/07/2018  . Tic disorder, unspecified 07/01/2018  . LGSIL on Pap smear of cervix 06/17/2018  . Transaminitis 06/15/2018  . Gastro-esophageal reflux disease without esophagitis 05/06/2018  . 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 History of drug abuse (HCC) 08/31/2015    Past  Surgical History:  Procedure Laterality Date  . CESAREAN SECTION    . EYE SURGERY       OB History    Gravida  9   Para  5   Term  1   Preterm  4   AB  3   Living  5     SAB  2   IAB  1   Ectopic      Multiple      Live Births  5           No family history on file.  Social History   Tobacco Use  . Smoking status: Former Games developer  . Smokeless tobacco: Never Used  Vaping Use  . Vaping Use: Never used  Substance Use Topics  . Alcohol use: Not Currently  . Drug use: Not Currently    Home Medications Prior to Admission medications   Medication Sig Start Date End Date Taking? Authorizing Provider  acetaminophen (TYLENOL) 500 MG tablet Take 1,000 mg by mouth every 6 (six) hours as needed for moderate pain.    [provider]  butalbital-acetaminophen-caffeine (FIORICET) 50-325-40 MG tablet Take 1-2 tablets by mouth every 6 (six) hours as needed for headache. 09/07/20 09/07/21  Aviva Signs, CNM  chlorhexidine (PERIDEX) 0.12 % solution Use as directed 15 mLs in the mouth or throat 2 (two) times daily. 09/10/20   Palumbo, April, MD  cyclobenzaprine (FLEXERIL) 10 MG tablet Take 1 tablet (10 mg total) by mouth 3 (three) times daily as needed for muscle spasms. 06/08/20   Aviva Signs, CNM  Elastic Bandages & Supports (COMFORT FIT MATERNITY SUPP MED)  MISC 1 Device by Does not apply route daily. 06/08/20   Aviva Signs, CNM  folic acid (FOLVITE) 1 MG tablet Take 1 mg by mouth daily.    [provider]  hyoscyamine (ANASPAZ) 0.125 MG TBDP disintergrating tablet Place 1 tablet (0.125 mg total) under the tongue every 6 (six) hours as needed for cramping. 09/07/20   Aviva Signs, CNM  lamoTRIgine (LAMICTAL) 25 MG tablet Take 25 mg by mouth 2 (two) times daily.    [provider]  ondansetron (ZOFRAN ODT) 4 MG disintegrating tablet Take 1 tablet (4 mg total) by mouth every 6 (six) hours as needed for nausea. 09/07/20   Aviva Signs, CNM  ondansetron (ZOFRAN) 8 MG tablet Take 1 tablet (8 mg total) by mouth every 8 (eight) hours as needed for nausea or vomiting. 10/30/20   Rhys Martini, PA-C    Allergies    Fish allergy, Ibuprofen, Mushroom extract complex, Prochlorperazine, Toradol [ketorolac tromethamine], Ketorolac, and Niacin and related  Review of Systems   Review of Systems  Constitutional: Positive for fatigue. Negative for chills and fever.  HENT: Negative for congestion and rhinorrhea.   Respiratory: Negative for cough and shortness of breath.   Cardiovascular: Negative for chest pain and palpitations.  Gastrointestinal: Positive for diarrhea. Negative for nausea and vomiting.  Genitourinary: Negative for difficulty urinating and dysuria.  Musculoskeletal: Negative for arthralgias and back pain.  Skin: Negative for rash and wound.  Neurological: Positive for headaches. Negative for light-headedness.    Physical Exam Updated Vital Signs BP 129/82 (BP Location: Left Arm)   Pulse 87   Temp 98.5 F (36.9 C)   Resp 15   SpO2 100%   Physical Exam Vitals and nursing note reviewed. Exam conducted with a chaperone present.  Constitutional:      General: She is not in acute distress.    Appearance: Normal appearance.  HENT:     Head: Normocephalic and atraumatic.     Nose: No rhinorrhea.     Mouth/Throat:     Mouth: Mucous membranes are moist.  Eyes:     General:        Right eye: No discharge.        Left eye: No discharge.     Conjunctiva/sclera: Conjunctivae normal.  Cardiovascular:     Rate and Rhythm: Normal rate and regular rhythm.  Pulmonary:     Effort: Pulmonary effort is normal. No respiratory distress.     Breath sounds: No stridor.  Abdominal:     General: Abdomen is flat. There is no distension.     Palpations: Abdomen is soft.     Tenderness: There is no abdominal tenderness. There is no guarding.  Musculoskeletal:        General: No tenderness or signs of injury.   Skin:    General: Skin is warm and dry.     Capillary Refill: Capillary refill takes less than 2 seconds.  Neurological:     General: No focal deficit present.     Mental Status: She is alert. Mental status is at baseline.     Motor: No weakness.     Coordination: Coordination normal.     Gait: Gait normal.  Psychiatric:        Mood and Affect: Mood normal.        Behavior: Behavior normal.     ED Results / Procedures / Treatments   Labs (all labs ordered are listed, but only abnormal results are  displayed) Labs Reviewed  SARS CORONAVIRUS 2 (TAT 6-24 HRS)    EKG None  Radiology No results found.  Procedures Procedures (including critical care time)  Medications Ordered in ED Medications - No data to display  ED Course  I have reviewed the triage vital signs and the nursing notes.  Pertinent labs & imaging results that were available during my care of the patient were reviewed by me and considered in my medical decision making (see chart for details).    MDM Rules/Calculators/A&P                          Patient is known to be COVID-positive comes here because she was told by her living facility that she must get tested as part of her contract to stay there.  She states they told her to go to the emergency room for this test.  I counseled her that she needs to talk to her supervising personnel at the living facility, with an alternative plan for the future however we will test her here today.  She continues to have mild symptoms.  Her vital signs are stable.  She is safe for outpatient management.  Return precautions are provided.  COVID test sent and pending at time of discharge likely still going to be positive. Final Clinical Impression(s) / ED Diagnoses Final diagnoses:  Diarrhea, infectious, adult  COVID    Rx / DC Orders ED Discharge Orders    None       Sabino Donovan, MD 11/08/20 1138

## 2020-11-19 ENCOUNTER — Emergency Department (HOSPITAL_COMMUNITY)
Admission: EM | Admit: 2020-11-19 | Discharge: 2020-11-20 | Disposition: A | Discharge status: Home / Self Care | Payer: Medicaid Other | Attending: Emergency Medicine | Admitting: Emergency Medicine

## 2020-11-19 ENCOUNTER — Other Ambulatory Visit: Payer: Self-pay

## 2020-11-19 DIAGNOSIS — R0981 Nasal congestion: Secondary | ICD-10-CM | POA: Diagnosis not present

## 2020-11-19 DIAGNOSIS — J029 Acute pharyngitis, unspecified: Secondary | ICD-10-CM | POA: Diagnosis present

## 2020-11-19 DIAGNOSIS — Z87891 Personal history of nicotine dependence: Secondary | ICD-10-CM | POA: Insufficient documentation

## 2020-11-19 DIAGNOSIS — B349 Viral infection, unspecified: Secondary | ICD-10-CM | POA: Insufficient documentation

## 2020-11-19 DIAGNOSIS — M549 Dorsalgia, unspecified: Secondary | ICD-10-CM | POA: Insufficient documentation

## 2020-11-19 DIAGNOSIS — U099 Post covid-19 condition, unspecified: Secondary | ICD-10-CM | POA: Insufficient documentation

## 2020-11-19 LAB — CBC
HCT: 37 % (ref 36.0–46.0)
Hemoglobin: 12.1 g/dL (ref 12.0–15.0)
MCH: 28.3 pg (ref 26.0–34.0)
MCHC: 32.7 g/dL (ref 30.0–36.0)
MCV: 86.7 fL (ref 80.0–100.0)
Platelets: 222 10*3/uL (ref 150–400)
RBC: 4.27 MIL/uL (ref 3.87–5.11)
RDW: 12.7 % (ref 11.5–15.5)
WBC: 8.7 10*3/uL (ref 4.0–10.5)
nRBC: 0 % (ref 0.0–0.2)

## 2020-11-19 LAB — BASIC METABOLIC PANEL
Anion gap: 8 (ref 5–15)
BUN: 12 mg/dL (ref 6–20)
CO2: 25 mmol/L (ref 22–32)
Calcium: 8.3 mg/dL — ABNORMAL LOW (ref 8.9–10.3)
Chloride: 106 mmol/L (ref 98–111)
Creatinine, Ser: 0.86 mg/dL (ref 0.44–1.00)
GFR, Estimated: >60 mL/min (ref >60)
Glucose, Bld: 128 mg/dL — ABNORMAL HIGH (ref 70–99)
Potassium: 3.4 mmol/L — ABNORMAL LOW (ref 3.5–5.1)
Sodium: 139 mmol/L (ref 135–145)

## 2020-11-19 NOTE — ED Triage Notes (Signed)
Pt presents to ED POV. Pt c/o neck pain and sore throat. Pt reports that she has had fever x2d, HA x3d. Pt reports that she had covid+ 2w ago ad was asymptomatic.

## 2020-11-20 LAB — GROUP A STREP BY PCR: Group A Strep by PCR: NOT DETECTED

## 2020-11-20 MED ORDER — DEXAMETHASONE 4 MG PO TABS
10.0000 mg | ORAL_TABLET | Freq: Once | ORAL | Status: AC
  Administered 2020-11-20: 10 mg via ORAL
  Filled 2020-11-20: qty 3

## 2020-11-20 NOTE — Discharge Instructions (Addendum)
You were evaluated in the Emergency Department and after careful evaluation, we did not find any emergent condition requiring admission or further testing in the hospital.  Your exam/testing today was overall reassuring.  Your symptoms seem to be due to a viral illness, possibly COVID-19.  We recommend home isolation per CDC recommendations, fluids and Tylenol at home.  Please return to the Emergency Department if you experience any worsening of your condition.  Thank you for allowing [place] to be a part of your care.

## 2020-11-20 NOTE — ED Provider Notes (Signed)
[facility] DEPT [facility] Emergency Department Provider Note MRN:  [id number]  Arrival date & time: 11/20/20     Chief Complaint   Sore Throat   History of Present Illness   Taylor Hodge is a 30 y.o. year-old female with no pertinent past medical presenting to the ED with chief complaint of sore throat.  Nasal congestion, sore throat, fever, cough for the past 2 or 3 days.  Woke up with sore neck today, worse with moving it.  Moderate in severity.  Also with aches and pains to the back, arms.  Denies chest pain or shortness of breath, no abdominal pain, no flank pain, no dysuria.  Tested positive for Covid 2 weeks ago.  Review of Systems  A complete 10 system review of systems was obtained and all systems are negative except as noted in the HPI and PMH.   Patient's Health History    Past Medical History:  Diagnosis Date  . Anemia   . Anxiety   . Seizures (HCC)     Past Surgical History:  Procedure Laterality Date  . CESAREAN SECTION    . EYE SURGERY      No family history on file.  Social History   Socioeconomic History  . Marital status: Single    Spouse name: Not on file  . Number of children: Not on file  . Years of education: Not on file  . Highest education level: Not on file  Occupational History  . Not on file  Tobacco Use  . Smoking status: Former Games developer  . Smokeless tobacco: Never Used  Vaping Use  . Vaping Use: Never used  Substance and Sexual Activity  . Alcohol use: Not Currently  . Drug use: Not Currently  . Sexual activity: Not Currently  Other Topics Concern  . Not on file  Social History Narrative  . Not on file   Social Determinants of Health   Financial Resource Strain: Not on file  Food Insecurity: Not on file  Transportation Needs: Not on file  Physical Activity: Not on file  Stress: Not on file  Social Connections: Not on file  Intimate Partner Violence: Not on file     Physical Exam   Vitals:   11/19/20 2219  11/20/20 0051  BP: (!) 130/93 130/90  Pulse: (!) 103 93  Resp: 18 20  Temp: 99.9 F (37.7 C)   SpO2: 99% 99%    CONSTITUTIONAL: Well-appearing, NAD NEURO:  Alert and oriented x 3, no focal deficits EYES:  eyes equal and reactive ENT/NECK:  no LAD, no JVD CARDIO: Regular rate, well-perfused, normal S1 and S2 PULM:  CTAB no wheezing or rhonchi GI/GU:  normal bowel sounds, non-distended, non-tender MSK/SPINE:  No gross deformities, no edema SKIN:  no rash, atraumatic PSYCH:  Appropriate speech and behavior  *Additional and/or pertinent findings included in MDM below  Diagnostic and Interventional Summary    EKG Interpretation  Date/Time:    Ventricular Rate:    PR Interval:    QRS Duration:   QT Interval:    QTC Calculation:   R Axis:     Text Interpretation:        Labs Reviewed  BASIC METABOLIC PANEL - Abnormal; Notable for the following components:      Result Value   Potassium 3.4 (*)    Glucose, Bld 128 (*)    Calcium 8.3 (*)    All other components within normal limits  GROUP A STREP BY PCR  CBC  No orders to display    Medications  dexamethasone (DECADRON) tablet 10 mg (has no administration in time range)     Procedures  /  Critical Care Procedures  ED Course and Medical Decision Making  I have reviewed the triage vital signs, the nursing notes, and pertinent available records from the EMR.  Listed above are laboratory and imaging tests that I personally ordered, reviewed, and interpreted and then considered in my medical decision making (see below for details).  Well-appearing, normal vital signs, no hypoxia, no increased work of breathing, clear lungs.  No asymmetry or signs of abscess to the posterior oropharynx, strep test is negative.  Suspect viral illness, possibly delayed symptoms from recent COVID-19 positive test.  Either way, appropriate for discharge with symptomatic management at home.       Elmer Sow. Pilar Plate, MD [facility]  Emergency Medicine [facility] [email or web address]  Final Clinical Impressions(s) / ED Diagnoses     ICD-10-CM   1. Viral illness  B34.9     ED Discharge Orders    None       Discharge Instructions Discussed with and Provided to Patient:     Discharge Instructions     You were evaluated in the Emergency Department and after careful evaluation, we did not find any emergent condition requiring admission or further testing in the hospital.  Your exam/testing today was overall reassuring.  Your symptoms seem to be due to a viral illness, possibly COVID-19.  We recommend home isolation per CDC recommendations, fluids and Tylenol at home.  Please return to the Emergency Department if you experience any worsening of your condition.  Thank you for allowing [place] to be a part of your care.       Sabas Sous, MD 11/20/20 [id number]

## 2021-01-03 ENCOUNTER — Emergency Department (HOSPITAL_COMMUNITY): Payer: Medicaid Other

## 2021-01-03 ENCOUNTER — Encounter (HOSPITAL_COMMUNITY): Payer: Self-pay | Admitting: Emergency Medicine

## 2021-01-03 ENCOUNTER — Other Ambulatory Visit: Payer: Self-pay

## 2021-01-03 ENCOUNTER — Emergency Department (HOSPITAL_COMMUNITY)
Admission: EM | Admit: 2021-01-03 | Discharge: 2021-01-03 | Disposition: A | Discharge status: Home / Self Care | Payer: Medicaid Other | Attending: Emergency Medicine | Admitting: Emergency Medicine

## 2021-01-03 DIAGNOSIS — Z87891 Personal history of nicotine dependence: Secondary | ICD-10-CM | POA: Diagnosis not present

## 2021-01-03 DIAGNOSIS — R1110 Vomiting, unspecified: Secondary | ICD-10-CM | POA: Insufficient documentation

## 2021-01-03 DIAGNOSIS — R102 Pelvic and perineal pain: Secondary | ICD-10-CM | POA: Diagnosis not present

## 2021-01-03 DIAGNOSIS — R112 Nausea with vomiting, unspecified: Secondary | ICD-10-CM

## 2021-01-03 DIAGNOSIS — R1030 Lower abdominal pain, unspecified: Secondary | ICD-10-CM

## 2021-01-03 LAB — URINALYSIS, ROUTINE W REFLEX MICROSCOPIC
Bilirubin Urine: NEGATIVE
Glucose, UA: NEGATIVE mg/dL
Hgb urine dipstick: NEGATIVE
Ketones, ur: 5 mg/dL — AB
Leukocytes,Ua: NEGATIVE
Nitrite: NEGATIVE
Protein, ur: 30 mg/dL — AB
Specific Gravity, Urine: 1.033 — ABNORMAL HIGH (ref 1.005–1.030)
pH: 5 (ref 5.0–8.0)

## 2021-01-03 LAB — COMPREHENSIVE METABOLIC PANEL
ALT: 17 U/L (ref 0–44)
AST: 21 U/L (ref 15–41)
Albumin: 4 g/dL (ref 3.5–5.0)
Alkaline Phosphatase: 63 U/L (ref 38–126)
Anion gap: 9 (ref 5–15)
BUN: 10 mg/dL (ref 6–20)
CO2: 22 mmol/L (ref 22–32)
Calcium: 9 mg/dL (ref 8.9–10.3)
Chloride: 106 mmol/L (ref 98–111)
Creatinine, Ser: 0.83 mg/dL (ref 0.44–1.00)
GFR, Estimated: >60 mL/min (ref >60)
Glucose, Bld: 116 mg/dL — ABNORMAL HIGH (ref 70–99)
Potassium: 3.2 mmol/L — ABNORMAL LOW (ref 3.5–5.1)
Sodium: 137 mmol/L (ref 135–145)
Total Bilirubin: 0.8 mg/dL (ref 0.3–1.2)
Total Protein: 6.6 g/dL (ref 6.5–8.1)

## 2021-01-03 LAB — CBC
HCT: 38.3 % (ref 36.0–46.0)
Hemoglobin: 12.9 g/dL (ref 12.0–15.0)
MCH: 28.9 pg (ref 26.0–34.0)
MCHC: 33.7 g/dL (ref 30.0–36.0)
MCV: 85.9 fL (ref 80.0–100.0)
Platelets: 226 10*3/uL (ref 150–400)
RBC: 4.46 MIL/uL (ref 3.87–5.11)
RDW: 13.9 % (ref 11.5–15.5)
WBC: 5.8 10*3/uL (ref 4.0–10.5)
nRBC: 0 % (ref 0.0–0.2)

## 2021-01-03 LAB — I-STAT BETA HCG BLOOD, ED (MC, WL, AP ONLY): I-stat hCG, quantitative: <5 m[IU]/mL (ref <5)

## 2021-01-03 LAB — LIPASE, BLOOD: Lipase: 31 U/L (ref 11–51)

## 2021-01-03 MED ORDER — ONDANSETRON 4 MG PO TBDP: ORAL_TABLET | ORAL | 0 refills | Status: DC

## 2021-01-03 MED ORDER — SODIUM CHLORIDE 0.9 % IV BOLUS
1000.0000 mL | Freq: Once | INTRAVENOUS | Status: AC
  Administered 2021-01-03: 1000 mL via INTRAVENOUS

## 2021-01-03 MED ORDER — FENTANYL CITRATE (PF) 100 MCG/2ML IJ SOLN
25.0000 ug | Freq: Once | INTRAMUSCULAR | Status: AC
  Administered 2021-01-03: 25 ug via INTRAVENOUS
  Filled 2021-01-03: qty 2

## 2021-01-03 MED ORDER — IOHEXOL 300 MG/ML  SOLN
100.0000 mL | Freq: Once | INTRAMUSCULAR | Status: AC | PRN
  Administered 2021-01-03: 100 mL via INTRAVENOUS

## 2021-01-03 MED ORDER — ONDANSETRON HCL 4 MG/2ML IJ SOLN
4.0000 mg | Freq: Once | INTRAMUSCULAR | Status: AC
  Administered 2021-01-03: 4 mg via INTRAVENOUS
  Filled 2021-01-03: qty 2

## 2021-01-03 NOTE — Discharge Instructions (Signed)
Your CT scan and ultrasound did not show anything abnormal in your bowels or ovaries  You likely have a stomach virus.  Take Tylenol or Motrin for pain.  Take Zofran for nausea  See your doctor for follow-up  Return to ER if you have worse abdominal pain, vomiting, fever, vaginal discharge

## 2021-01-03 NOTE — ED Provider Notes (Signed)
[facility] EMERGENCY DEPARTMENT Provider Note   CSN: [id number] Arrival date & time: 01/03/21  1707     History Chief Complaint  Patient presents with  . Abdominal Pain    Rafaella Kole is a 30 y.o. female history of anxiety, seizures, recent trichomonas and yeast infection, here presenting with pelvic pain.  Patient was seen at GYN office last week and was diagnosed with a yeast infection after getting treated for trichomonas.  She states that she finished her course of treatment for yeast infection.  She states that she has some bleeding last week.  She states that since yesterday, she has been having lower pelvic pain.  She states is diffuse but worse on the right side.  Has some associated flank pain and vomiting as well.  Denies any fevers or chills or diarrhea.  Denies any previous abdominal surgeries.  She did have a C-section in the past.  The history is provided by the patient.       Past Medical History:  Diagnosis Date  . Anemia   . Anxiety   . Seizures [facility])     Patient Active Problem List   Diagnosis Date Noted  . Hx of cesarean section 08/06/2020  . Anxiety   . Preterm uterine contractions in third trimester, antepartum 08/02/2020  . Maternal obesity affecting pregnancy, antepartum 05/09/2020  . Poor weight gain of pregnancy, third trimester 05/09/2020  . Hepatitis C antibody positive in blood 03/09/2020  . History of preterm delivery, currently pregnant 03/07/2020  . Previous cesarean section complicating pregnancy 03/07/2020  . Elevated BP without diagnosis of hypertension 05/03/2019  . Nicotine dependence, cigarettes, uncomplicated 05/03/2019  . Attention deficit hyperactivity disorder (ADHD), combined type 12/14/2018  . Opioid use disorder, moderate, in early remission, in controlled environment, dependence (HCC) 12/14/2018  . Seizure disorder (HCC) 10/05/2018  . Bipolar affective disorder, currently depressed, moderate (HCC)  09/07/2018  . Generalized anxiety disorder 09/07/2018  . Tic disorder, unspecified 07/01/2018  . LGSIL on Pap smear of cervix 06/17/2018  . Transaminitis 06/15/2018  . Gastro-esophageal reflux disease without esophagitis 05/06/2018  . 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 History of drug abuse (HCC) 08/31/2015    Past Surgical History:  Procedure Laterality Date  . CESAREAN SECTION    . EYE SURGERY       OB History    Gravida  9   Para  5   Term  1   Preterm  4   AB  3   Living  5     SAB  2   IAB  1   Ectopic      Multiple      Live Births  5           No family history on file.  Social History   Tobacco Use  . Smoking status: Former Games developer  . Smokeless tobacco: Never Used  Vaping Use  . Vaping Use: Never used  Substance Use Topics  . Alcohol use: Not Currently  . Drug use: Not Currently    Home Medications Prior to Admission medications   Medication Sig Start Date End Date Taking? Authorizing Provider  acetaminophen (TYLENOL) 500 MG tablet Take 1,000 mg by mouth every 6 (six) hours as needed for moderate pain.    [provider]  butalbital-acetaminophen-caffeine (FIORICET) 50-325-40 MG tablet Take 1-2 tablets by mouth every 6 (six) hours as needed for headache. 09/07/20 09/07/21  Aviva Signs, CNM  chlorhexidine (PERIDEX) 0.12 %  solution Use as directed 15 mLs in the mouth or throat 2 (two) times daily. 09/10/20   Palumbo, April, MD  cyclobenzaprine (FLEXERIL) 10 MG tablet Take 1 tablet (10 mg total) by mouth 3 (three) times daily as needed for muscle spasms. 06/08/20   Aviva Signs, CNM  Elastic Bandages & Supports (COMFORT FIT MATERNITY SUPP MED) MISC 1 Device by Does not apply route daily. 06/08/20   Aviva Signs, CNM  folic acid (FOLVITE) 1 MG tablet Take 1 mg by mouth daily.    [provider]  hyoscyamine (ANASPAZ) 0.125 MG TBDP disintergrating tablet Place 1 tablet (0.125 mg total) under the tongue  every 6 (six) hours as needed for cramping. 09/07/20   Aviva Signs, CNM  lamoTRIgine (LAMICTAL) 25 MG tablet Take 25 mg by mouth 2 (two) times daily.    [provider]  ondansetron (ZOFRAN ODT) 4 MG disintegrating tablet Take 1 tablet (4 mg total) by mouth every 6 (six) hours as needed for nausea. 09/07/20   Aviva Signs, CNM  ondansetron (ZOFRAN) 8 MG tablet Take 1 tablet (8 mg total) by mouth every 8 (eight) hours as needed for nausea or vomiting. 10/30/20   Rhys Martini, PA-C    Allergies    Ibuprofen, Mushroom extract complex, Prochlorperazine, Toradol [ketorolac tromethamine], Ketorolac, and Niacin and related  Review of Systems   Review of Systems  Gastrointestinal: Positive for abdominal pain.  All other systems reviewed and are negative.   Physical Exam Updated Vital Signs BP 125/84 (BP Location: Left Arm)   Pulse 84   Temp 98.3 F (36.8 C)   Resp 16   SpO2 95%   Physical Exam Vitals and nursing note reviewed.  Constitutional:      Appearance: She is well-developed.  HENT:     Head: Normocephalic.     Mouth/Throat:     Mouth: Mucous membranes are moist.  Eyes:     Extraocular Movements: Extraocular movements intact.     Pupils: Pupils are equal, round, and reactive to light.  Cardiovascular:     Rate and Rhythm: Normal rate and regular rhythm.     Heart sounds: Normal heart sounds.  Pulmonary:     Effort: Pulmonary effort is normal.     Breath sounds: Normal breath sounds.  Abdominal:     General: Abdomen is flat.     Comments: Mild diffuse pelvic tenderness.  No focal right lower quadrant tenderness or periumbilical tenderness  Genitourinary:    Comments: Deferred as patient just had a pelvic exam done a week ago has no discharge Skin:    General: Skin is warm.     Capillary Refill: Capillary refill takes less than 2 seconds.  Neurological:     General: No focal deficit present.     Mental Status: She is alert and oriented to person,  place, and time.  Psychiatric:        Mood and Affect: Mood normal.        Behavior: Behavior normal.     ED Results / Procedures / Treatments   Labs (all labs ordered are listed, but only abnormal results are displayed) Labs Reviewed  COMPREHENSIVE METABOLIC PANEL - Abnormal; Notable for the following components:      Result Value   Potassium 3.2 (*)    Glucose, Bld 116 (*)    All other components within normal limits  LIPASE, BLOOD  CBC  URINALYSIS, ROUTINE W REFLEX MICROSCOPIC  I-STAT BETA HCG BLOOD,  ED ([facility], [facility], [facility] ONLY)    EKG None  Radiology No results found.  Procedures Procedures   Medications Ordered in ED Medications  sodium chloride 0.9 % bolus 1,000 mL (has no administration in time range)  ondansetron (ZOFRAN) injection 4 mg (has no administration in time range)  fentaNYL (SUBLIMAZE) injection 25 mcg (has no administration in time range)    ED Course  I have reviewed the triage vital signs and the nursing notes.  Pertinent labs & imaging results that were available during my care of the patient were reviewed by me and considered in my medical decision making (see chart for details).    MDM Rules/Calculators/A&P                         Sharanda Shinault is a 30 y.o. female here presenting with pelvic pain.  Consider ruptured ovarian cyst versus fibroids.  Less likely torsion.  Patient just was tested negative for STD a week ago and she has no vaginal discharge so we will hold off on pelvic exam right now.  Will get pelvic ultrasound and CBC and CMP and urinalysis and renal ultrasound  10:28 PM UA unremarkable.  Ultrasound was unremarkable but she was still having cramps and CT abdomen pelvis was performed and did not show any diverticulitis or colitis or appendicitis.  I think likely viral gastroenteritis.  No vomiting in the ED after Zofran.  Stable for discharge with Zofran  Final Clinical Impression(s) / ED Diagnoses Final diagnoses:  None    Rx /  DC Orders ED Discharge Orders    None       Charlynne Pander, MD 01/03/21 2229

## 2021-01-03 NOTE — ED Triage Notes (Signed)
Patient complains of abdominal pain and vomiting that started yesterday. Patient also requesting pregnancy test. Patient alert, oriented, and in no apparent distress at this time.

## 2021-02-14 ENCOUNTER — Emergency Department (HOSPITAL_COMMUNITY)
Admission: EM | Admit: 2021-02-14 | Discharge: 2021-02-15 | Disposition: A | Discharge status: Home / Self Care | Payer: Medicaid Other | Attending: Emergency Medicine | Admitting: Emergency Medicine

## 2021-02-14 ENCOUNTER — Other Ambulatory Visit: Payer: Self-pay

## 2021-02-14 ENCOUNTER — Encounter (HOSPITAL_COMMUNITY): Payer: Self-pay | Admitting: Emergency Medicine

## 2021-02-14 DIAGNOSIS — O26899 Other specified pregnancy related conditions, unspecified trimester: Secondary | ICD-10-CM

## 2021-02-14 DIAGNOSIS — R1030 Lower abdominal pain, unspecified: Secondary | ICD-10-CM | POA: Diagnosis present

## 2021-02-14 DIAGNOSIS — Z87891 Personal history of nicotine dependence: Secondary | ICD-10-CM | POA: Insufficient documentation

## 2021-02-14 DIAGNOSIS — N888 Other specified noninflammatory disorders of cervix uteri: Secondary | ICD-10-CM | POA: Diagnosis not present

## 2021-02-14 DIAGNOSIS — M549 Dorsalgia, unspecified: Secondary | ICD-10-CM | POA: Insufficient documentation

## 2021-02-14 DIAGNOSIS — B379 Candidiasis, unspecified: Secondary | ICD-10-CM | POA: Insufficient documentation

## 2021-02-14 DIAGNOSIS — R55 Syncope and collapse: Secondary | ICD-10-CM | POA: Insufficient documentation

## 2021-02-14 LAB — COMPREHENSIVE METABOLIC PANEL
ALT: 16 U/L (ref 0–44)
AST: 18 U/L (ref 15–41)
Albumin: 3.8 g/dL (ref 3.5–5.0)
Alkaline Phosphatase: 70 U/L (ref 38–126)
Anion gap: 8 (ref 5–15)
BUN: 11 mg/dL (ref 6–20)
CO2: 26 mmol/L (ref 22–32)
Calcium: 8.9 mg/dL (ref 8.9–10.3)
Chloride: 104 mmol/L (ref 98–111)
Creatinine, Ser: 0.94 mg/dL (ref 0.44–1.00)
GFR, Estimated: >60 mL/min (ref >60)
Glucose, Bld: 104 mg/dL — ABNORMAL HIGH (ref 70–99)
Potassium: 3.7 mmol/L (ref 3.5–5.1)
Sodium: 138 mmol/L (ref 135–145)
Total Bilirubin: 0.5 mg/dL (ref 0.3–1.2)
Total Protein: 6.6 g/dL (ref 6.5–8.1)

## 2021-02-14 LAB — CBC WITH DIFFERENTIAL/PLATELET
Abs Immature Granulocytes: 0.02 10*3/uL (ref 0.00–0.07)
Basophils Absolute: 0.1 10*3/uL (ref 0.0–0.1)
Basophils Relative: 1 %
Eosinophils Absolute: 0.1 10*3/uL (ref 0.0–0.5)
Eosinophils Relative: 1 %
HCT: 39.9 % (ref 36.0–46.0)
Hemoglobin: 13.1 g/dL (ref 12.0–15.0)
Immature Granulocytes: 0 %
Lymphocytes Relative: 29 %
Lymphs Abs: 2.5 10*3/uL (ref 0.7–4.0)
MCH: 29 pg (ref 26.0–34.0)
MCHC: 32.8 g/dL (ref 30.0–36.0)
MCV: 88.3 fL (ref 80.0–100.0)
Monocytes Absolute: 0.6 10*3/uL (ref 0.1–1.0)
Monocytes Relative: 7 %
Neutro Abs: 5.5 10*3/uL (ref 1.7–7.7)
Neutrophils Relative %: 62 %
Platelets: 241 10*3/uL (ref 150–400)
RBC: 4.52 MIL/uL (ref 3.87–5.11)
RDW: 13.2 % (ref 11.5–15.5)
WBC: 8.7 10*3/uL (ref 4.0–10.5)
nRBC: 0 % (ref 0.0–0.2)

## 2021-02-14 LAB — I-STAT BETA HCG BLOOD, ED (MC, WL, AP ONLY): I-stat hCG, quantitative: 81.1 m[IU]/mL — ABNORMAL HIGH (ref <5)

## 2021-02-14 NOTE — ED Triage Notes (Signed)
Pt report several syncopal episodes and lower abdominal pain.  Pt reports several positive home preg tests.

## 2021-02-14 NOTE — ED Triage Notes (Signed)
Emergency Medicine Provider Triage Evaluation Note  Maanvi Lecompte , a 30 y.o. female  was evaluated in triage.  Pt complains of lower abdominal pain, LMP 01/15/21, reports home pregnancy test + x 4, went to [facility] ER yesterday and told negative urine test but didn't do blood work. Reports feeling light headed today and then passed out twice at home. No Cp, SHOB.  Review of Systems  Positive: Lower abdominal pain, syncope Negative: CP, SHOB  Physical Exam  There were no vitals taken for this visit. Gen:   Awake, no distress   HEENT:  Atraumatic  Resp:  Normal effort  Cardiac:  Normal rate  Abd:   Nondistended, nontender  MSK:   Moves extremities without difficulty  Neuro:  Speech clear   Medical Decision Making  Medically screening exam initiated at 7:50 PM.  Appropriate orders placed.  Rose Hegner was informed that the remainder of the evaluation will be completed by another provider, this initial triage assessment does not replace that evaluation, and the importance of remaining in the ED until their evaluation is complete.  Clinical Impression     Alden Hipp 02/14/21 1956

## 2021-02-15 LAB — WET PREP, GENITAL
Clue Cells Wet Prep HPF POC: NONE SEEN
Sperm: NONE SEEN
Trich, Wet Prep: NONE SEEN

## 2021-02-15 LAB — TYPE AND SCREEN
ABO/RH(D): O POS
Antibody Screen: NEGATIVE

## 2021-02-15 LAB — GC/CHLAMYDIA PROBE AMP (~~LOC~~) NOT AT ARMC
Chlamydia: NEGATIVE
Comment: NEGATIVE
Comment: NORMAL
Neisseria Gonorrhea: NEGATIVE

## 2021-02-15 LAB — HCG, QUANTITATIVE, PREGNANCY: hCG, Beta Chain, Quant, S: 100 m[IU]/mL — ABNORMAL HIGH (ref <5)

## 2021-02-15 MED ORDER — CLOTRIMAZOLE 1 % VA CREA: 1.0000 | TOPICAL_CREAM | Freq: Every day | VAGINAL | 0 refills | Status: AC

## 2021-02-15 NOTE — Discharge Instructions (Addendum)
Seen today for passing out and lower abdominal pain.  Your pregnancy test was positive.  Beta hCG was 100.  You had an ultrasound 1 day ago at another facility that did not show any evidence of an ectopic pregnancy.  Your labs and exam right now are reassuring.  However, it is very important that you follow-up for repeat beta-hCG and ultrasound.  You should follow-up in 2 to 3 days.  If you develop worsening abdominal pain, vaginal bleeding soaking through more than 1 pad per hour, or any new or worsening symptoms you should be reevaluated.

## 2021-02-15 NOTE — ED Provider Notes (Signed)
[facility] EMERGENCY DEPARTMENT Provider Note   CSN: [id number] Arrival date & time: 02/14/21  1941     History Chief Complaint  Patient presents with  . Near Syncope  . Abdominal Pain    Taylor Hodge is a 30 y.o. female.  HPI     This is a 30 year old [name] female who presents with lower abdominal pain and "fainting."  Patient reports she has had several day history of lower abdominal cramping and pain.  She feels like it is more on the right than the left.  She was due for her menstrual period on Monday.  Since that time she has had multiple positive home pregnancy test.  However, she reports going to the outside hospital and having a negative urine pregnancy test there.  She states that she had an ultrasound and "something was wrong but they did not know what."  She had a baby in December 2021.  Since that time she has had 2 normal periods.  Her last menstrual period was 3/28.  Patient denies any vaginal bleeding.  She does report some back pain.  No dysuria or hematuria.  When discussing her loss of consciousness, patient states she felt very dizzy and all of a sudden "I just blacked out."  No chest pain or shortness of breath.  She never had a history of syncope in the past.  Chart reviewed.  Patient was seen on the evening of 4/26.  She had a negative urine pregnancy test but notable lab work.  Ultrasound at that time did show an abnormally thickened endometrial stripe with some endometrial fluid.  Ovaries were not visualized.  This was just over 24 hours ago.  Past Medical History:  Diagnosis Date  . Anemia   . Anxiety   . Seizures [facility])     Patient Active Problem List   Diagnosis Date Noted  . Hx of cesarean section 08/06/2020  . Anxiety   . Preterm uterine contractions in third trimester, antepartum 08/02/2020  . Maternal obesity affecting pregnancy, antepartum 05/09/2020  . Poor weight gain of pregnancy, third trimester 05/09/2020  . Hepatitis C  antibody positive in blood 03/09/2020  . History of preterm delivery, currently pregnant 03/07/2020  . Previous cesarean section complicating pregnancy 03/07/2020  . Elevated BP without diagnosis of hypertension 05/03/2019  . Nicotine dependence, cigarettes, uncomplicated 05/03/2019  . Attention deficit hyperactivity disorder (ADHD), combined type 12/14/2018  . Opioid use disorder, moderate, in early remission, in controlled environment, dependence (HCC) 12/14/2018  . Seizure disorder (HCC) 10/05/2018  . Bipolar affective disorder, currently depressed, moderate (HCC) 09/07/2018  . Generalized anxiety disorder 09/07/2018  . Tic disorder, unspecified 07/01/2018  . LGSIL on Pap smear of cervix 06/17/2018  . Transaminitis 06/15/2018  . Gastro-esophageal reflux disease without esophagitis 05/06/2018  . 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 History of drug abuse (HCC) 08/31/2015    Past Surgical History:  Procedure Laterality Date  . CESAREAN SECTION    . EYE SURGERY       OB History    Gravida  9   Para  5   Term  1   Preterm  4   AB  3   Living  5     SAB  2   IAB  1   Ectopic      Multiple      Live Births  5           No family history on file.  Social History  Tobacco Use  . Smoking status: Former Games developer  . Smokeless tobacco: Never Used  Vaping Use  . Vaping Use: Never used  Substance Use Topics  . Alcohol use: Not Currently  . Drug use: Not Currently    Home Medications Prior to Admission medications   Medication Sig Start Date End Date Taking? Authorizing Provider  clotrimazole (GYNE-LOTRIMIN) 1 % vaginal cream Place 1 Applicatorful vaginally at bedtime for 7 days. 02/15/21 02/22/21 Yes Sallyann Kinnaird, Mayer Masker, MD  butalbital-acetaminophen-caffeine (FIORICET) [phone] MG tablet Take 1-2 tablets by mouth every 6 (six) hours as needed for headache. Patient not taking: Reported on 02/15/2021 09/07/20 09/07/21  Aviva Signs, CNM   chlorhexidine (PERIDEX) 0.12 % solution Use as directed 15 mLs in the mouth or throat 2 (two) times daily. Patient not taking: Reported on 02/15/2021 09/10/20   Palumbo, April, MD  cyclobenzaprine (FLEXERIL) 10 MG tablet Take 1 tablet (10 mg total) by mouth 3 (three) times daily as needed for muscle spasms. Patient not taking: Reported on 02/15/2021 06/08/20   Aviva Signs, CNM  Elastic Bandages & Supports (COMFORT FIT MATERNITY SUPP MED) MISC 1 Device by Does not apply route daily. Patient not taking: Reported on 02/15/2021 06/08/20   Aviva Signs, CNM  hyoscyamine (ANASPAZ) 0.125 MG TBDP disintergrating tablet Place 1 tablet (0.125 mg total) under the tongue every 6 (six) hours as needed for cramping. Patient not taking: Reported on 02/15/2021 09/07/20   Aviva Signs, CNM  ondansetron (ZOFRAN ODT) 4 MG disintegrating tablet 4mg  ODT q4 hours prn nausea/vomit Patient not taking: Reported on 02/15/2021 01/03/21   01/05/21, MD  ondansetron (ZOFRAN) 8 MG tablet Take 1 tablet (8 mg total) by mouth every 8 (eight) hours as needed for nausea or vomiting. Patient not taking: Reported on 02/15/2021 10/30/20   12/28/20, PA-C    Allergies    Ibuprofen, Mushroom extract complex, Prochlorperazine, Toradol [ketorolac tromethamine], Ketorolac, and Niacin and related  Review of Systems   Review of Systems  Constitutional: Negative for fever.  Respiratory: Negative for shortness of breath.   Cardiovascular: Negative for chest pain.  Gastrointestinal: Positive for abdominal pain. Negative for diarrhea, nausea and vomiting.  Genitourinary: Negative for dysuria and hematuria.  Musculoskeletal: Positive for back pain.  Neurological: Positive for syncope.  All other systems reviewed and are negative.   Physical Exam Updated Vital Signs BP 111/83   Pulse 76   Temp 98.8 F (37.1 C) (Oral)   Resp 20   Ht 1.499 m (4\' 11" )   Wt 68 kg   SpO2 98%   BMI 30.30 kg/m   Physical  Exam Vitals and nursing note reviewed.  Constitutional:      Appearance: She is well-developed. She is not ill-appearing.  HENT:     Head: Normocephalic and atraumatic.  Eyes:     Pupils: Pupils are equal, round, and reactive to light.  Cardiovascular:     Rate and Rhythm: Normal rate and regular rhythm.     Heart sounds: Normal heart sounds.  Pulmonary:     Effort: Pulmonary effort is normal. No respiratory distress.     Breath sounds: No wheezing.  Abdominal:     General: Bowel sounds are normal.     Palpations: Abdomen is soft.     Tenderness: There is no abdominal tenderness. There is no guarding or rebound.  Genitourinary:    Vagina: Normal.     Cervix: Discharge present.     Adnexa:  Right: No mass or tenderness.         Left: No mass or tenderness.    Musculoskeletal:     Cervical back: Neck supple.  Skin:    General: Skin is warm and dry.  Neurological:     Mental Status: She is alert and oriented to person, place, and time.  Psychiatric:        Mood and Affect: Mood normal.     ED Results / Procedures / Treatments   Labs (all labs ordered are listed, but only abnormal results are displayed) Labs Reviewed  WET PREP, GENITAL - Abnormal; Notable for the following components:      Result Value   Yeast Wet Prep HPF POC PRESENT (*)    WBC, Wet Prep HPF POC MANY (*)    All other components within normal limits  COMPREHENSIVE METABOLIC PANEL - Abnormal; Notable for the following components:   Glucose, Bld 104 (*)    All other components within normal limits  HCG, QUANTITATIVE, PREGNANCY - Abnormal; Notable for the following components:   hCG, Beta Chain, Quant, S 100 (*)    All other components within normal limits  I-STAT BETA HCG BLOOD, ED ([facility], [facility], [facility] ONLY) - Abnormal; Notable for the following components:   I-stat hCG, quantitative 81.1 (*)    All other components within normal limits  CBC WITH DIFFERENTIAL/PLATELET  TYPE AND SCREEN  GC/CHLAMYDIA PROBE  AMP ([place]) NOT AT [facility]    EKG EKG Interpretation  Date/Time:  Wednesday February 14 2021 20:09:40 EDT Ventricular Rate:  93 PR Interval:  130 QRS Duration: 86 QT Interval:  360 QTC Calculation: 447 R Axis:   88 Text Interpretation: Normal sinus rhythm Normal ECG Confirmed by Ross Marcus ([id number]) on 02/15/2021 2:12:05 AM   Radiology No results found.  Procedures Procedures   Medications Ordered in ED Medications - No data to display  ED Course  I have reviewed the triage vital signs and the nursing notes.  Pertinent labs & imaging results that were available during my care of the patient were reviewed by me and considered in my medical decision making (see chart for details).    MDM Rules/Calculators/A&P                          Patient presents with several complaints.  Reports positive home pregnancy test.  Also reports lower abdominal discomfort and faintness and near syncope at home.  She is overall nontoxic and vital signs are reassuring.  She was seen at an outside hospital yesterday and had a negative urine pregnancy test.  I reviewed the ultrasound.  She had a thickened endometrial stripe with some increased fluid.  Today her i-STAT beta-hCG is 81.  Repeat in lab is 100.  She reports that she just missed a menstrual period.  Given this and ultrasound findings from approximately 24 hours ago, suspect she is in very early pregnancy.  Her pelvic exam does not reveal any adnexal mass or significant tenderness.  I have lower suspicion for ectopic at this time although cannot fully exclude this.  She is not significantly anemic and her hemoglobin is normal.  She does have evidence of yeast on wet prep.  She will be treated for this.  Lab work-up is otherwise reassuring.  EKG shows no evidence of acute arrhythmia.  Overall work-up is reassuring.  Highly suspect she is in very early pregnancy although cannot fully rule out ectopic.  No  evidence for repeating ultrasound today  as she had an ultrasound less than 24 hours ago.  However, I stressed to the patient that it is very important that she follow-up with her primary OB/GYN for repeat beta-hCG and ultrasound in 2 to 3 days to ensure appropriate doubling and further evaluation for ectopic.  Patient stated understanding.  She was given strict return precautions including worsening pain or onset of vaginal bleeding.  After history, exam, and medical workup I feel the patient has been appropriately medically screened and is safe for discharge home. Pertinent diagnoses were discussed with the patient. Patient was given return precautions.  Final Clinical Impression(s) / ED Diagnoses Final diagnoses:  Syncope, unspecified syncope type  Abdominal pain affecting pregnancy  Yeast infection    Rx / DC Orders ED Discharge Orders         Ordered    clotrimazole (GYNE-LOTRIMIN) 1 % vaginal cream  Daily at bedtime        02/15/21 0434           Renel Ende, Mayer Maskerourtney F, MD 02/15/21 [phone]

## 2021-02-15 NOTE — ED Notes (Signed)
Patient verbalized understanding of discharge instructions. Opportunity for questions and answers.  

## 2021-02-18 ENCOUNTER — Encounter (HOSPITAL_COMMUNITY): Payer: Self-pay

## 2021-02-18 ENCOUNTER — Inpatient Hospital Stay (HOSPITAL_COMMUNITY)
Admission: AD | Admit: 2021-02-18 | Discharge: 2021-02-18 | Disposition: A | Discharge status: Home / Self Care | Payer: Medicaid Other | Attending: Obstetrics and Gynecology | Admitting: Obstetrics and Gynecology

## 2021-02-18 ENCOUNTER — Other Ambulatory Visit: Payer: Self-pay

## 2021-02-18 DIAGNOSIS — O039 Complete or unspecified spontaneous abortion without complication: Secondary | ICD-10-CM | POA: Diagnosis not present

## 2021-02-18 DIAGNOSIS — Z87891 Personal history of nicotine dependence: Secondary | ICD-10-CM | POA: Diagnosis not present

## 2021-02-18 DIAGNOSIS — Z3A01 Less than 8 weeks gestation of pregnancy: Secondary | ICD-10-CM | POA: Diagnosis not present

## 2021-02-18 LAB — URINALYSIS, ROUTINE W REFLEX MICROSCOPIC
Bilirubin Urine: NEGATIVE
Glucose, UA: NEGATIVE mg/dL
Ketones, ur: NEGATIVE mg/dL
Leukocytes,Ua: NEGATIVE
Nitrite: NEGATIVE
Protein, ur: NEGATIVE mg/dL
Specific Gravity, Urine: 1.028 (ref 1.005–1.030)
pH: 5 (ref 5.0–8.0)

## 2021-02-18 LAB — COMPREHENSIVE METABOLIC PANEL
ALT: 16 U/L (ref 0–44)
AST: 16 U/L (ref 15–41)
Albumin: 3.9 g/dL (ref 3.5–5.0)
Alkaline Phosphatase: 63 U/L (ref 38–126)
Anion gap: 6 (ref 5–15)
BUN: 11 mg/dL (ref 6–20)
CO2: 26 mmol/L (ref 22–32)
Calcium: 9 mg/dL (ref 8.9–10.3)
Chloride: 104 mmol/L (ref 98–111)
Creatinine, Ser: 0.69 mg/dL (ref 0.44–1.00)
GFR, Estimated: >60 mL/min (ref >60)
Glucose, Bld: 116 mg/dL — ABNORMAL HIGH (ref 70–99)
Potassium: 3.8 mmol/L (ref 3.5–5.1)
Sodium: 136 mmol/L (ref 135–145)
Total Bilirubin: 0.4 mg/dL (ref 0.3–1.2)
Total Protein: 6.7 g/dL (ref 6.5–8.1)

## 2021-02-18 LAB — CBC
HCT: 39.5 % (ref 36.0–46.0)
Hemoglobin: 13.2 g/dL (ref 12.0–15.0)
MCH: 29.1 pg (ref 26.0–34.0)
MCHC: 33.4 g/dL (ref 30.0–36.0)
MCV: 87.2 fL (ref 80.0–100.0)
Platelets: 250 10*3/uL (ref 150–400)
RBC: 4.53 MIL/uL (ref 3.87–5.11)
RDW: 13.3 % (ref 11.5–15.5)
WBC: 7.9 10*3/uL (ref 4.0–10.5)
nRBC: 0 % (ref 0.0–0.2)

## 2021-02-18 LAB — HCG, QUANTITATIVE, PREGNANCY: hCG, Beta Chain, Quant, S: 75 m[IU]/mL — ABNORMAL HIGH (ref <5)

## 2021-02-18 MED ORDER — ONDANSETRON HCL 8 MG PO TABS: 8.0000 mg | ORAL_TABLET | Freq: Three times a day (TID) | ORAL | 0 refills | Status: AC | PRN

## 2021-02-18 MED ORDER — OXYCODONE-ACETAMINOPHEN 5-325 MG PO TABS: 1.0000 | ORAL_TABLET | Freq: Four times a day (QID) | ORAL | 0 refills | Status: DC | PRN

## 2021-02-18 MED ORDER — ACETAMINOPHEN 500 MG PO TABS
1000.0000 mg | ORAL_TABLET | Freq: Once | ORAL | Status: AC
  Administered 2021-02-18: 1000 mg via ORAL
  Filled 2021-02-18: qty 2

## 2021-02-18 NOTE — ED Provider Notes (Signed)
Emergency Medicine Provider OB Triage Evaluation Note  Taylor Hodge is a 30 y.o. female, [id number], at Unknown gestation who presents to the emergency department with complaints of worsening abdominal pain and vaginal bleeding that began today.  hCG 100 on 02/15/2021.  Went to her OB clinic on Friday and was up to 141.  Worsening abdominal pain and bleeding today.  Initial ultrasound was not diagnostic.  Review of  Systems  Positive: abd pain, vaginal bleeding Negative: fever  Physical Exam  BP 100/86 (BP Location: Left Arm)   Pulse 85   Temp 99.2 F (37.3 C) (Oral)   Resp 18   SpO2 98%  General: Awake, no distress  HEENT: Atraumatic  Resp: Normal effort  Cardiac: Normal rate Abd: Nondistended, nontender  MSK: Moves all extremities without difficulty Neuro: Speech clear  Medical Decision Making  Pt evaluated for pregnancy concern and is stable for transfer to [facility]. Pt is in agreement with plan for transfer.  3:00 PM Discussed with [facility]U APP, Taylor Hodge, who accepts patient in transfer.  Clinical Impression  No diagnosis found.     Adaora Mchaney, [place] N, PA-C 02/18/21 1500    Cathren Laine, MD 02/21/21 [phone]

## 2021-02-18 NOTE — Discharge Instructions (Signed)
Miscarriage A miscarriage is the loss of pregnancy before the 20th week. Most miscarriages happen during the first 3 months of pregnancy. Sometimes, a miscarriage can happen before a woman knows that she is pregnant. Having a miscarriage can be an emotional experience. If you have had a miscarriage, talk with your health care provider about any questions you may have about the loss of your baby, the grieving process, and your plans for future pregnancy. What are the causes? Many times, the cause of a miscarriage is not known. What increases the risk? The following factors may make a pregnant woman more likely to have a miscarriage: Certain medical conditions  Conditions that affect the hormone balance in the body, such as thyroid disease or polycystic ovary syndrome.  Diabetes.  Autoimmune disorders.  Infections.  Bleeding disorders.  Obesity. Lifestyle factors  Using products with tobacco or nicotine in them or being exposed to tobacco smoke.  Having alcohol.  Having large amounts of caffeine.  Recreational drug use. Problems with reproductive organs or structures  Cervical insufficiency. This is when the lowest part of the uterus (cervix) opens and thins before pregnancy is at term.  Having a condition called Asherman syndrome. This syndrome causes scarring in the uterus or causes the uterus to be abnormal in structure.  Fibrous growths, called fibroids, in the uterus.  Congenital abnormalities. These problems are present at birth.  Infection of the cervix or uterus. Personal or medical history  Injury (trauma).  Having had a miscarriage before.  Being younger than age 18 or older than age 35.  Exposure to harmful substances in the environment. This may include radiation or heavy metals, such as lead.  Use of certain medicines. What are the signs or symptoms? Symptoms of this condition include:  Vaginal bleeding or spotting, with or without cramps or  pain.  Pain or cramping in the abdomen or lower back.  Fluid or tissue coming out of the vagina. How is this diagnosed? This condition may be diagnosed based on:  A physical exam.  Ultrasound.  Lab tests, such as blood tests, urine tests, or swabs for infection. How is this treated? Treatment for a miscarriage is sometimes not needed if all the pregnancy tissue that was in the uterus comes out on its own, and there are no other problems such as infection or heavy bleeding. In other cases, this condition may be treated with:  Dilation and curettage (D&C). In this procedure, the cervix is stretched open and any remaining pregnancy tissue is removed from the lining of the uterus (endometrium).  Medicines. These may include: ? Antibiotic medicine, to treat infection. ? Medicine to help any remaining pregnancy tissue come out of the body. ? Medicine to reduce (contract) the size of the uterus. These medicines may be given if there is a lot of bleeding. If you have Rh-negative blood, you may be given an injection of a medicine called Rho(D) immune globulin. This medicine helps prevent problems with future pregnancies. Follow these instructions at home: Medicines  Take over-the-counter and prescription medicines only as told by your health care provider.  If you were prescribed antibiotic medicine, take it as told by your health care provider. Do not stop taking the antibiotic even if you start to feel better. Activity  Rest as told by your health care provider. Ask your health care provider what activities are safe for you.  Have someone help with home and family responsibilities during this time. General instructions  Monitor how much tissue   or blood clot material comes out of the vagina.  Do not have sex, douche, or put anything, such as tampons, in your vagina until your health care provider says it is okay.  To help you and your partner with the grieving process, talk with your  health care provider or get counseling.  When you are ready, meet with your health care provider to discuss any important steps you should take for your health. Also, discuss steps you should take to have a healthy pregnancy in the future.  Keep all follow-up visits. This is important.   Where to find more information  The American College of Obstetricians and Gynecologists: acog.org  [place] Department of Health and Human Services Office of Women's Health: hrsa.gov/office-womens-health Contact a health care provider if:  You have a fever or chills.  There is bad-smelling fluid coming from the vagina.  You have more bleeding instead of less.  Tissue or blood clots come out of your vagina. Get help right away if:  You have severe cramps or pain in your back or abdomen.  Heavy bleeding soaks through 2 large sanitary pads an hour for more than 2 hours.  You become light-headed or weak.  You faint.  You feel sad, and your sadness takes over your thoughts.  You think about hurting yourself. If you ever feel like you may hurt yourself or others, or have thoughts about taking your own life, get help right away. Go to your nearest emergency department or:  Call your local emergency services (911 in the [place]  Call a suicide crisis helpline, such as the National Suicide Prevention Lifeline at 1-800-273-8255. This is open 24 hours a day in the [place]  Text the Crisis Text Line at 741741 (in the [place] Summary  Most miscarriages happen in the first 3 months of pregnancy. Sometimes miscarriage happens before a woman knows that she is pregnant.  Follow instructions from your health care provider about medicines and activity.  To help you and your partner with grieving, talk with your health care provider or get counseling.  Keep all follow-up visits. This information is not intended to replace advice given to you by your health care provider. Make sure you discuss any questions you  have with your health care provider. Document Revised: 04/07/2020 Document Reviewed: 04/07/2020 Elsevier Patient Education  2021 [employer]  

## 2021-02-18 NOTE — MAU Provider Note (Signed)
History     CSN: [id number]  Arrival date and time: 02/18/21 1357   Event Date/Time   First Provider Initiated Contact with Patient 02/18/21 1728      Chief Complaint  Patient presents with  . Vaginal Bleeding   HPI Taylor Hodge is a 30 y.o. [id number] at [redacted]w[redacted]d who presents from [facility] for vaginal bleeding. She was seen in the ED on 4/26 and told she was pregnant. She followed up with her OB in [facility] on 4/29 and had a repeat HCG and ultrasound without anything in the uterus. She states she started bleeding like a period today and having cramping. She rates the cramping a 5/10 and has not taken anything for the pain.   Previous HCGs: 4/26: 100 4/29: 141  OB History    Gravida  10   Para  5   Term  1   Preterm  4   AB  3   Living  6     SAB  2   IAB  1   Ectopic      Multiple      Live Births  5           Past Medical History:  Diagnosis Date  . Anemia   . Anxiety   . Seizures (HCC)     Past Surgical History:  Procedure Laterality Date  . CESAREAN SECTION    . EYE SURGERY      No family history on file.  Social History   Tobacco Use  . Smoking status: Former Smoker    Types: Cigarettes  . Smokeless tobacco: Never Used  . Tobacco comment: not currently  Vaping Use  . Vaping Use: Never used  Substance Use Topics  . Alcohol use: Not Currently  . Drug use: Not Currently    Allergies:  Allergies  Allergen Reactions  . Ibuprofen Hives  . Mushroom Extract Complex Anaphylaxis  . Prochlorperazine Anxiety, Anaphylaxis and Hives    hives  . Toradol [Ketorolac Tromethamine] Shortness Of Breath  . Ketorolac Itching and Rash  . Niacin And Related Other (See Comments)    Flushing, hot flashes    Medications Prior to Admission  Medication Sig Dispense Refill Last Dose  . butalbital-acetaminophen-caffeine (FIORICET) 50-325-40 MG tablet Take 1-2 tablets by mouth every 6 (six) hours as needed for headache. (Patient not taking: Reported on  02/15/2021) 20 tablet 0   . chlorhexidine (PERIDEX) 0.12 % solution Use as directed 15 mLs in the mouth or throat 2 (two) times daily. (Patient not taking: Reported on 02/15/2021) 120 mL 0   . clotrimazole (GYNE-LOTRIMIN) 1 % vaginal cream Place 1 Applicatorful vaginally at bedtime for 7 days. 45 g 0   . cyclobenzaprine (FLEXERIL) 10 MG tablet Take 1 tablet (10 mg total) by mouth 3 (three) times daily as needed for muscle spasms. (Patient not taking: Reported on 02/15/2021) 30 tablet 2   . Elastic Bandages & Supports (COMFORT FIT MATERNITY SUPP MED) MISC 1 Device by Does not apply route daily. (Patient not taking: Reported on 02/15/2021) 1 each 1   . hyoscyamine (ANASPAZ) 0.125 MG TBDP disintergrating tablet Place 1 tablet (0.125 mg total) under the tongue every 6 (six) hours as needed for cramping. (Patient not taking: Reported on 02/15/2021) 20 tablet 0   . ondansetron (ZOFRAN ODT) 4 MG disintegrating tablet 4mg  ODT q4 hours prn nausea/vomit (Patient not taking: Reported on 02/15/2021) 10 tablet 0   . ondansetron (ZOFRAN) 8 MG tablet Take 1 tablet (  8 mg total) by mouth every 8 (eight) hours as needed for nausea or vomiting. (Patient not taking: Reported on 02/15/2021) 20 tablet 0     Review of Systems  Constitutional: Negative.  Negative for fatigue and fever.  HENT: Negative.   Respiratory: Negative.  Negative for shortness of breath.   Cardiovascular: Negative.  Negative for chest pain.  Gastrointestinal: Positive for abdominal pain. Negative for constipation, diarrhea, nausea and vomiting.  Genitourinary: Positive for vaginal bleeding. Negative for dysuria.  Neurological: Negative.  Negative for dizziness and headaches.   Physical Exam   Blood pressure 110/86, pulse 81, temperature 98 F (36.7 C), temperature source Oral, resp. rate 17, weight 64.9 kg, last menstrual period 01/15/2021, SpO2 98 %.  Physical Exam Vitals and nursing note reviewed.  Constitutional:      General: She is not in  acute distress.    Appearance: She is well-developed.  HENT:     Head: Normocephalic.  Eyes:     Pupils: Pupils are equal, round, and reactive to light.  Cardiovascular:     Rate and Rhythm: Normal rate and regular rhythm.     Heart sounds: Normal heart sounds.  Pulmonary:     Effort: Pulmonary effort is normal. No respiratory distress.     Breath sounds: Normal breath sounds.  Abdominal:     General: Bowel sounds are normal. There is no distension.     Palpations: Abdomen is soft.     Tenderness: There is no abdominal tenderness.  Genitourinary:    Comments: Small amount of bright red bleeding Skin:    General: Skin is warm and dry.  Neurological:     Mental Status: She is alert and oriented to person, place, and time.  Psychiatric:        Behavior: Behavior normal.        Thought Content: Thought content normal.        Judgment: Judgment normal.     MAU Course  Procedures Results for orders placed or performed during the hospital encounter of 02/18/21 (from the past 24 hour(s))  hCG, quantitative, pregnancy     Status: Abnormal   Collection Time: 02/18/21  4:51 PM  Result Value Ref Range   hCG, Beta Chain, Quant, S 75 (H) <5 mIU/mL  CBC     Status: None   Collection Time: 02/18/21  4:51 PM  Result Value Ref Range   WBC 7.9 4.0 - 10.5 K/uL   RBC 4.53 3.87 - 5.11 MIL/uL   Hemoglobin 13.2 12.0 - 15.0 g/dL   HCT 93.8 10.1 - 75.1 %   MCV 87.2 80.0 - 100.0 fL   MCH 29.1 26.0 - 34.0 pg   MCHC 33.4 30.0 - 36.0 g/dL   RDW 02.5 85.2 - 77.8 %   Platelets 250 150 - 400 K/uL   nRBC 0.0 0.0 - 0.2 %  Comprehensive metabolic panel     Status: Abnormal   Collection Time: 02/18/21  4:51 PM  Result Value Ref Range   Sodium 136 135 - 145 mmol/L   Potassium 3.8 3.5 - 5.1 mmol/L   Chloride 104 98 - 111 mmol/L   CO2 26 22 - 32 mmol/L   Glucose, Bld 116 (H) 70 - 99 mg/dL   BUN 11 6 - 20 mg/dL   Creatinine, Ser 2.42 0.44 - 1.00 mg/dL   Calcium 9.0 8.9 - 35.3 mg/dL   Total Protein  6.7 6.5 - 8.1 g/dL   Albumin 3.9 3.5 - 5.0 g/dL  AST 16 15 - 41 U/L   ALT 16 0 - 44 U/L   Alkaline Phosphatase 63 38 - 126 U/L   Total Bilirubin 0.4 0.3 - 1.2 mg/dL   GFR, Estimated >74 >94 mL/min   Anion gap 6 5 - 15  Urinalysis, Routine w reflex microscopic Urine, Clean Catch     Status: Abnormal   Collection Time: 02/18/21  5:06 PM  Result Value Ref Range   Color, Urine YELLOW YELLOW   APPearance CLOUDY (A) CLEAR   Specific Gravity, Urine 1.028 1.005 - 1.030   pH 5.0 5.0 - 8.0   Glucose, UA NEGATIVE NEGATIVE mg/dL   Hgb urine dipstick SMALL (A) NEGATIVE   Bilirubin Urine NEGATIVE NEGATIVE   Ketones, ur NEGATIVE NEGATIVE mg/dL   Protein, ur NEGATIVE NEGATIVE mg/dL   Nitrite NEGATIVE NEGATIVE   Leukocytes,Ua NEGATIVE NEGATIVE   RBC / HPF 21-50 0 - 5 RBC/hpf   WBC, UA 6-10 0 - 5 WBC/hpf   Bacteria, UA RARE (A) NONE SEEN   Squamous Epithelial / LPF 6-10 0 - 5   Mucus PRESENT    Ca Oxalate Crys, UA PRESENT    MDM HCG, CBC, CMP  Discussed with patient results of HCG indicating miscarriage. Follow up plan discussed at length and patient desires to follow up with OB in [facility]. Vaginal bleeding and pain precautions reviewed at length.   Assessment and Plan   1. Miscarriage   2. [redacted] weeks gestation of pregnancy    -Discharge home in stable condition -Rx for percocet and zofran sent to patient's pharmacy -Vaginal bleeding and pain precautions discussed -Patient advised to follow-up with OB this week for repeat HCG -Patient may return to MAU as needed or if her condition were to change or worsen   Rolm Bookbinder CNM 02/18/2021, 5:28 PM

## 2021-02-18 NOTE — MAU Note (Signed)
.   Taylor Hodge is a 30 y.o. at [redacted]w[redacted]d here in MAU reporting: vaginal bleeding that started this morning, She states that it started out light pink and progressed into bleeding like a period. Has not saturated a pad in an hour. Also is having lower abdominal cramping she rates 6/10. No recent intercourse. Did take monistat 7 last night for a yeast infection.  LMP: 01/15/21 Pain score: 6 Vitals:   02/18/21 1403 02/18/21 1701  BP: 100/86 110/86  Pulse: 85 81  Resp: 18 17  Temp: 99.2 F (37.3 C) 98 F (36.7 C)  SpO2: 98%       Lab orders placed from triage: UA

## 2021-02-18 NOTE — ED Triage Notes (Addendum)
Patient complains of vaginal bleeding and pelvic cramping that started yesterday. Patient was told she had positive beta and approximately [redacted] weeks pregnant. Patient alert and oriented, NAD G10, P3

## 2021-02-20 ENCOUNTER — Other Ambulatory Visit: Payer: Self-pay | Admitting: Advanced Practice Midwife

## 2021-02-20 MED ORDER — ONDANSETRON 4 MG PO TBDP: ORAL_TABLET | ORAL | 0 refills | Status: DC

## 2021-02-20 MED ORDER — OXYCODONE-ACETAMINOPHEN 5-325 MG PO TABS: 1.0000 | ORAL_TABLET | Freq: Four times a day (QID) | ORAL | 0 refills | Status: DC | PRN

## 2021-02-20 NOTE — Progress Notes (Signed)
Patient called MAU to report that original requested pharmacy ([facility] on [address]) was closed and to request prescriptions be resent to different pharmacy. CNM spoke with Pharmacy Tech at [facility] on [address], who confirmed [address] location is closed, prescription shows as active but not picked up. Original prescriptions for Zofran and Percocet resent to [facility] on [address].   Patient notified by phone at 1530.  Clayton Bibles, MSN, CNM Certified Nurse Midwife, [employer] for [employer], [facility] 02/20/21 3:30 PM

## 2021-02-26 ENCOUNTER — Other Ambulatory Visit: Payer: Self-pay

## 2021-02-26 ENCOUNTER — Emergency Department (HOSPITAL_COMMUNITY)
Admission: EM | Admit: 2021-02-26 | Discharge: 2021-02-26 | Disposition: A | Discharge status: Home / Self Care | Payer: Medicaid Other | Attending: Emergency Medicine | Admitting: Emergency Medicine

## 2021-02-26 ENCOUNTER — Encounter (HOSPITAL_COMMUNITY): Payer: Self-pay | Admitting: Emergency Medicine

## 2021-02-26 DIAGNOSIS — R1031 Right lower quadrant pain: Secondary | ICD-10-CM | POA: Insufficient documentation

## 2021-02-26 DIAGNOSIS — Z20822 Contact with and (suspected) exposure to covid-19: Secondary | ICD-10-CM | POA: Insufficient documentation

## 2021-02-26 DIAGNOSIS — R1032 Left lower quadrant pain: Secondary | ICD-10-CM | POA: Diagnosis not present

## 2021-02-26 DIAGNOSIS — D72829 Elevated white blood cell count, unspecified: Secondary | ICD-10-CM | POA: Insufficient documentation

## 2021-02-26 DIAGNOSIS — K219 Gastro-esophageal reflux disease without esophagitis: Secondary | ICD-10-CM | POA: Insufficient documentation

## 2021-02-26 DIAGNOSIS — R070 Pain in throat: Secondary | ICD-10-CM | POA: Diagnosis present

## 2021-02-26 DIAGNOSIS — J020 Streptococcal pharyngitis: Secondary | ICD-10-CM | POA: Diagnosis not present

## 2021-02-26 DIAGNOSIS — Z87891 Personal history of nicotine dependence: Secondary | ICD-10-CM | POA: Diagnosis not present

## 2021-02-26 LAB — RESP PANEL BY RT-PCR (FLU A&B, COVID) ARPGX2
Influenza A by PCR: NEGATIVE
Influenza B by PCR: NEGATIVE
SARS Coronavirus 2 by RT PCR: NEGATIVE

## 2021-02-26 LAB — URINALYSIS, ROUTINE W REFLEX MICROSCOPIC
Bacteria, UA: NONE SEEN
Bilirubin Urine: NEGATIVE
Glucose, UA: NEGATIVE mg/dL
Ketones, ur: NEGATIVE mg/dL
Nitrite: NEGATIVE
Protein, ur: NEGATIVE mg/dL
Specific Gravity, Urine: 1.017 (ref 1.005–1.030)
pH: 8 (ref 5.0–8.0)

## 2021-02-26 LAB — CBC
HCT: 35.9 % — ABNORMAL LOW (ref 36.0–46.0)
Hemoglobin: 12.3 g/dL (ref 12.0–15.0)
MCH: 29.5 pg (ref 26.0–34.0)
MCHC: 34.3 g/dL (ref 30.0–36.0)
MCV: 86.1 fL (ref 80.0–100.0)
Platelets: 180 10*3/uL (ref 150–400)
RBC: 4.17 MIL/uL (ref 3.87–5.11)
RDW: 13 % (ref 11.5–15.5)
WBC: 9.6 10*3/uL (ref 4.0–10.5)
nRBC: 0 % (ref 0.0–0.2)

## 2021-02-26 LAB — LIPASE, BLOOD: Lipase: 30 U/L (ref 11–51)

## 2021-02-26 LAB — GROUP A STREP BY PCR: Group A Strep by PCR: DETECTED — AB

## 2021-02-26 LAB — COMPREHENSIVE METABOLIC PANEL
ALT: 13 U/L (ref 0–44)
AST: 16 U/L (ref 15–41)
Albumin: 4 g/dL (ref 3.5–5.0)
Alkaline Phosphatase: 75 U/L (ref 38–126)
Anion gap: 7 (ref 5–15)
BUN: 16 mg/dL (ref 6–20)
CO2: 25 mmol/L (ref 22–32)
Calcium: 8.9 mg/dL (ref 8.9–10.3)
Chloride: 104 mmol/L (ref 98–111)
Creatinine, Ser: 0.92 mg/dL (ref 0.44–1.00)
GFR, Estimated: >60 mL/min (ref >60)
Glucose, Bld: 108 mg/dL — ABNORMAL HIGH (ref 70–99)
Potassium: 3.6 mmol/L (ref 3.5–5.1)
Sodium: 136 mmol/L (ref 135–145)
Total Bilirubin: 0.2 mg/dL — ABNORMAL LOW (ref 0.3–1.2)
Total Protein: 7 g/dL (ref 6.5–8.1)

## 2021-02-26 LAB — HCG, QUANTITATIVE, PREGNANCY: hCG, Beta Chain, Quant, S: 2 m[IU]/mL (ref <5)

## 2021-02-26 MED ORDER — SODIUM CHLORIDE 0.9 % IV BOLUS
1000.0000 mL | Freq: Once | INTRAVENOUS | Status: AC
  Administered 2021-02-26: 1000 mL via INTRAVENOUS

## 2021-02-26 MED ORDER — ACETAMINOPHEN 325 MG PO TABS
650.0000 mg | ORAL_TABLET | Freq: Once | ORAL | Status: AC
  Administered 2021-02-26: 650 mg via ORAL
  Filled 2021-02-26: qty 2

## 2021-02-26 MED ORDER — PENICILLIN G BENZATHINE 1200000 UNIT/2ML IM SUSY
1.2000 10*6.[IU] | PREFILLED_SYRINGE | Freq: Once | INTRAMUSCULAR | Status: AC
  Administered 2021-02-26: 1.2 10*6.[IU] via INTRAMUSCULAR
  Filled 2021-02-26: qty 2

## 2021-02-26 NOTE — ED Notes (Signed)
Patient is complaining of pain all over. Patient states that her throat hurt also.

## 2021-02-26 NOTE — ED Notes (Signed)
Requested urine from patient. 

## 2021-02-26 NOTE — ED Triage Notes (Signed)
Patient brought from home by [facility]. Patient had a miscarriage last Sunday. Patient has a headache, abdominal pain, and neck pain. BP-122/68 hr-100.

## 2021-02-26 NOTE — ED Provider Notes (Signed)
[facility] Provider Note   CSN: [id number] Arrival date & time: 02/26/21  0013     History Chief Complaint  Patient presents with  . Abdominal Pain    Headache, neck pain    Taylor Hodge is a 30 y.o. female with a history of epilepsy on Lamictal, anxiety, opioid use disorder in remission who presents the emergency department with a chief complaint of fever.  The patient reports sudden onset fever, T-max 102.9, chills, myalgias, sore throat, headache, nasal congestion, and abdominal pain, onset today.  The patient was seen last week in the emergency department for a spontaneous miscarriage after she developed vaginal bleeding approximately 5 weeks into her pregnancy.  He was discharged with Percocet and Zofran and has been following up with her OB/GYN for downtrending hCGs.  Most recent hCG was 23 on 5/3.  She reports that vaginal bleeding subsided yesterday.  She does endorse bilateral cramping, lower abdominal pain.  No dysuria, hematuria, back pain, flank pain, vaginal discharge, cough, shortness of breath, dysphagia, numbness, weakness, neck pain or stiffness.  Reports that she was tested for STIs when she found out she was pregnant and testing was negative.  No new sexual partners.  She did treat her symptoms earlier today with prescribed Percocet, but no other treatment prior to arrival.  No known sick contacts.  Denies alcohol use since she had a positive pregnancy test at home.  Denies illicit or recreational substance use.  The history is provided by the patient and medical records. No language interpreter was used.       Past Medical History:  Diagnosis Date  . Anemia   . Anxiety   . Seizures [facility])     Patient Active Problem List   Diagnosis Date Noted  . Hx of cesarean section 08/06/2020  . Anxiety   . Preterm uterine contractions in third trimester, antepartum 08/02/2020  . Maternal obesity affecting pregnancy, antepartum 05/09/2020   . Poor weight gain of pregnancy, third trimester 05/09/2020  . Hepatitis C antibody positive in blood 03/09/2020  . History of preterm delivery, currently pregnant 03/07/2020  . Previous cesarean section complicating pregnancy 03/07/2020  . Elevated BP without diagnosis of hypertension 05/03/2019  . Nicotine dependence, cigarettes, uncomplicated 05/03/2019  . Attention deficit hyperactivity disorder (ADHD), combined type 12/14/2018  . Opioid use disorder, moderate, in early remission, in controlled environment, dependence (HCC) 12/14/2018  . Seizure disorder (HCC) 10/05/2018  . Bipolar affective disorder, currently depressed, moderate (HCC) 09/07/2018  . Generalized anxiety disorder 09/07/2018  . Tic disorder, unspecified 07/01/2018  . LGSIL on Pap smear of cervix 06/17/2018  . Transaminitis 06/15/2018  . Gastro-esophageal reflux disease without esophagitis 05/06/2018  . 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 History of drug abuse (HCC) 08/31/2015    Past Surgical History:  Procedure Laterality Date  . CESAREAN SECTION    . EYE SURGERY       OB History    Gravida  10   Para  5   Term  1   Preterm  4   AB  3   Living  6     SAB  2   IAB  1   Ectopic      Multiple      Live Births  5           History reviewed. No pertinent family history.  Social History   Tobacco Use  . Smoking status: Former Smoker    Types: Cigarettes  .  Smokeless tobacco: Never Used  . Tobacco comment: not currently  Vaping Use  . Vaping Use: Never used  Substance Use Topics  . Alcohol use: Not Currently  . Drug use: Not Currently    Home Medications Prior to Admission medications   Medication Sig Start Date End Date Taking? Authorizing Provider  ondansetron (ZOFRAN) 8 MG tablet Take 1 tablet (8 mg total) by mouth every 8 (eight) hours as needed for nausea or vomiting. 02/18/21  Yes Rolm Bookbinder, CNM  oxyCODONE-acetaminophen (PERCOCET/ROXICET) 5-325 MG tablet  Take 1 tablet by mouth every 6 (six) hours as needed for severe pain. 02/20/21  Yes Clayton Bibles C, CNM  hyoscyamine (ANASPAZ) 0.125 MG TBDP disintergrating tablet Place 1 tablet (0.125 mg total) under the tongue every 6 (six) hours as needed for cramping. Patient not taking: No sig reported 09/07/20 02/26/21  Aviva Signs, CNM    Allergies    Ibuprofen, Mushroom extract complex, Prochlorperazine, Toradol [ketorolac tromethamine], Ketorolac, and Niacin and related  Review of Systems   Review of Systems  Constitutional: Positive for chills and fever. Negative for activity change.  HENT: Positive for congestion and sore throat. Negative for trouble swallowing and voice change.   Respiratory: Negative for cough and shortness of breath.   Cardiovascular: Negative for chest pain.  Gastrointestinal: Positive for abdominal pain. Negative for diarrhea, nausea and vomiting.  Genitourinary: Negative for dysuria, enuresis, flank pain, frequency, hematuria, pelvic pain, urgency and vaginal bleeding.  Musculoskeletal: Positive for myalgias. Negative for back pain.  Skin: Negative for color change, rash and wound.  Allergic/Immunologic: Negative for immunocompromised state.  Neurological: Positive for headaches. Negative for seizures, syncope, weakness and numbness.  Psychiatric/Behavioral: Negative for confusion.    Physical Exam Updated Vital Signs BP 107/61   Pulse (!) 112   Temp 99.3 F (37.4 C) (Oral)   Resp (!) 26   Ht 4\' 11"  (1.499 m)   Wt 68 kg   LMP 01/15/2021   SpO2 97%   Breastfeeding Unknown Comment: miscarriage  BMI 30.30 kg/m   Physical Exam Vitals and nursing note reviewed.  Constitutional:      General: She is not in acute distress.    Appearance: She is not ill-appearing, toxic-appearing or diaphoretic.     Comments: Patient is having rigors.  HENT:     Head: Normocephalic.     Mouth/Throat:     Pharynx: Posterior oropharyngeal erythema present.     Comments:  Posterior oropharynx with erythema and 1+ tonsillar edema. Eyes:     Conjunctiva/sclera: Conjunctivae normal.  Cardiovascular:     Rate and Rhythm: Regular rhythm. Tachycardia present.     Heart sounds: No murmur heard. No friction rub. No gallop.   Pulmonary:     Effort: Pulmonary effort is normal. No respiratory distress.     Breath sounds: No stridor. No wheezing, rhonchi or rales.  Chest:     Chest wall: No tenderness.  Abdominal:     General: There is no distension.     Palpations: Abdomen is soft. There is no mass.     Tenderness: There is abdominal tenderness. There is no right CVA tenderness, guarding or rebound.     Hernia: No hernia is present.     Comments: Tender to palpation bilateral lower abdomen.  Left minimally greater than right.  No guarding or rebound.  Abdomen is soft and nondistended.  Striae noted to the abdominal wall.  No tenderness over McBurney's point.  Negative Murphy sign.  No  CVA tenderness bilaterally.  Musculoskeletal:        General: No tenderness.     Cervical back: Neck supple.     Right lower leg: No edema.     Left lower leg: No edema.  Skin:    General: Skin is warm.     Capillary Refill: Capillary refill takes less than 2 seconds.     Findings: No rash.     Comments: Skin is hot to the touch.  Neurological:     Mental Status: She is alert.  Psychiatric:        Behavior: Behavior normal.     ED Results / Procedures / Treatments   Labs (all labs ordered are listed, but only abnormal results are displayed) Labs Reviewed  GROUP A STREP BY PCR - Abnormal; Notable for the following components:      Result Value   Group A Strep by PCR DETECTED (*)    All other components within normal limits  COMPREHENSIVE METABOLIC PANEL - Abnormal; Notable for the following components:   Glucose, Bld 108 (*)    Total Bilirubin 0.2 (*)    All other components within normal limits  CBC - Abnormal; Notable for the following components:   HCT 35.9 (*)     All other components within normal limits  URINALYSIS, ROUTINE W REFLEX MICROSCOPIC - Abnormal; Notable for the following components:   Hgb urine dipstick MODERATE (*)    Leukocytes,Ua LARGE (*)    All other components within normal limits  RESP PANEL BY RT-PCR (FLU A&B, COVID) ARPGX2  URINE CULTURE  LIPASE, BLOOD  HCG, QUANTITATIVE, PREGNANCY    EKG None  Radiology No results found.  Procedures Procedures   Medications Ordered in ED Medications  acetaminophen (TYLENOL) tablet 650 mg (650 mg Oral Given 02/26/21 0153)  penicillin g benzathine (BICILLIN LA) 1200000 UNIT/2ML injection 1.2 Million Units (1.2 Million Units Intramuscular Given 02/26/21 0153)  sodium chloride 0.9 % bolus 1,000 mL (0 mLs Intravenous Stopped 02/26/21 0236)    ED Course  I have reviewed the triage vital signs and the nursing notes.  Pertinent labs & imaging results that were available during my care of the patient were reviewed by me and considered in my medical decision making (see chart for details).    MDM Rules/Calculators/A&P                          30 year old female with a history of epilepsy on Lamictal, anxiety, opioid use disorder in remission who presents the emergency department with a 1 day history of fever, chills, myalgias, sore throat, headache, and abdominal pain.  Patient was seen last week for a spontaneous miscarriage and has been followed by OB/GYN for downtrending hCG.  Last hCG was 23.  Vaginal bleeding resolved yesterday and she denies any vaginal discharge or GU complaints.  Afebrile on arrival, but on recheck temperature was 101.2 on my evaluation with tachycardia.  Vital signs are otherwise stable.  On exam posterior oropharynx is erythematous and edematous.  She does have mild tender to palpation in the bilateral lower abdomen without tenderness over McBurney's point.  No varicocele signs.  Labs have been reviewed and independently interpreted by me.  Strep PCR was positive.   She has no allergies and will be treated with Bicillin.  COVID-19 and influenza are negative.  She has no leukocytosis.  No metabolic derangements.  Quantitative hCG is 2.  She is having no GU complaints.  Doubt abdominal pain is secondary to endometrial-itis OR TOA.  Repeat abdominal exam is benign.  UA does contain leukocyte esterase and hemoglobinuria, but I suspect this is secondary to recent spontaneous miscarriage.  The patient was discussed with Dr. Rubin PayorPickering, attending physician.  No indication for pelvic exam at this time given benign abdominal exam with reassuring labs.  However, will send UA for culture.  Doubt appendicitis, cholecystitis, pyelonephritis, ovarian torsion, bowel obstruction.  On reevaluation, patient is feeling much improved.  Fever has resolved.  She is hemodynamically stable in no acute distress.  Home supportive care measures discussed.  Safe for discharge home with outpatient follow-up as indicated.  Final Clinical Impression(s) / ED Diagnoses Final diagnoses:  Streptococcal pharyngitis    Rx / DC Orders ED Discharge Orders    None       Barkley BoardsMcDonald, Stephanine Reas A, PA-C 02/26/21 0354    Benjiman CorePickering, Nathan, MD 02/26/21 [phone]

## 2021-02-26 NOTE — Discharge Instructions (Addendum)
Thank you for allowing me to care for you today in the Emergency Department.    You tested positive for strep throat today.  This is the most likely cause of your symptoms.  You were treated for this infection with an injection of penicillin in the emergency department.  You can take 650 mg of Tylenol once every 6 hours for fever.  Do not take more than 4000 mg of Tylenol from all sources in a 24-hour period.  Make sure that you do not eat or drink after others because this infection is contagious.  You should start using a new toothbrush.  You can return to work once you have been fever free for 24 hours without taking Tylenol.  Make sure that you rest and drink plenty of fluids to avoid dehydration.  Return to the emergency department if you develop difficulty swallowing, fever, unable to move your neck, have severe abdominal pain, uncontrollable vomiting, or other new, concerning symptoms.

## 2021-02-27 LAB — URINE CULTURE

## 2021-04-25 ENCOUNTER — Telehealth (HOSPITAL_COMMUNITY): Payer: Self-pay

## 2021-04-25 ENCOUNTER — Other Ambulatory Visit: Payer: Self-pay

## 2021-04-25 ENCOUNTER — Ambulatory Visit (HOSPITAL_COMMUNITY)
Admission: EM | Admit: 2021-04-25 | Discharge: 2021-04-25 | Disposition: A | Discharge status: Home / Self Care | Payer: Medicaid Other | Attending: Student | Admitting: Student

## 2021-04-25 ENCOUNTER — Encounter (HOSPITAL_COMMUNITY): Payer: Self-pay | Admitting: *Deleted

## 2021-04-25 DIAGNOSIS — L089 Local infection of the skin and subcutaneous tissue, unspecified: Secondary | ICD-10-CM | POA: Diagnosis not present

## 2021-04-25 DIAGNOSIS — N760 Acute vaginitis: Secondary | ICD-10-CM

## 2021-04-25 LAB — POCT URINALYSIS DIPSTICK, ED / UC
Bilirubin Urine: NEGATIVE
Glucose, UA: NEGATIVE mg/dL
Hgb urine dipstick: NEGATIVE
Ketones, ur: NEGATIVE mg/dL
Nitrite: NEGATIVE
Protein, ur: NEGATIVE mg/dL
Specific Gravity, Urine: 1.02 (ref 1.005–1.030)
Urobilinogen, UA: 1 mg/dL (ref 0.0–1.0)
pH: 7 (ref 5.0–8.0)

## 2021-04-25 LAB — POC URINE PREG, ED: Preg Test, Ur: NEGATIVE

## 2021-04-25 MED ORDER — DOXYCYCLINE HYCLATE 100 MG PO CAPS: 100.0000 mg | ORAL_CAPSULE | Freq: Two times a day (BID) | ORAL | 0 refills | Status: DC

## 2021-04-25 MED ORDER — METRONIDAZOLE 500 MG PO TABS: 500.0000 mg | ORAL_TABLET | Freq: Two times a day (BID) | ORAL | 0 refills | Status: DC

## 2021-04-25 MED ORDER — DOXYCYCLINE HYCLATE 100 MG PO CAPS: 100.0000 mg | ORAL_CAPSULE | Freq: Two times a day (BID) | ORAL | 0 refills | Status: AC

## 2021-04-25 NOTE — ED Provider Notes (Signed)
[facility]    CSN: [id number] Arrival date & time: 04/25/21  1813      History   Chief Complaint Chief Complaint  Patient presents with   Vaginal Itching   Vaginal Discharge   Otalgia    RT    HPI Taylor Hodge is a 30 y.o. female presenting with vaginal itching and discharge, as well as right ear pain.  Medical history bacterial vaginosis. -She endorses 3 days of external vaginal itching with thin white discharge and odor.  States these are her typical BV symptoms.  She is monogamous with the same female partner, denies STI risk. Denies hematuria, dysuria, frequency, urgency, back pain, n/v/d/abd pain, fevers/chills, abdnormal vaginal lesions/rashes.  -Also with one large pimple external R ear canal, painful. Denies hearing changes, fevers/chills. Denies recent URI.   HPI  Past Medical History:  Diagnosis Date   Anemia    Anxiety    Seizures (HCC)     Patient Active Problem List   Diagnosis Date Noted   Hx of cesarean section 08/06/2020   Anxiety    Preterm uterine contractions in third trimester, antepartum 08/02/2020   Maternal obesity affecting pregnancy, antepartum 05/09/2020   Poor weight gain of pregnancy, third trimester 05/09/2020   Hepatitis C antibody positive in blood 03/09/2020   History of preterm delivery, currently pregnant 03/07/2020   Previous cesarean section complicating pregnancy 03/07/2020   Elevated BP without diagnosis of hypertension 05/03/2019   Nicotine dependence, cigarettes, uncomplicated 05/03/2019   Attention deficit hyperactivity disorder (ADHD), combined type 12/14/2018   Opioid use disorder, moderate, in early remission, in controlled environment, dependence (HCC) 12/14/2018   Seizure disorder (HCC) 10/05/2018   Bipolar affective disorder, currently depressed, moderate (HCC) 09/07/2018   Generalized anxiety disorder 09/07/2018   Tic disorder, unspecified 07/01/2018   LGSIL on Pap smear of cervix 06/17/2018    Transaminitis 06/15/2018   Gastro-esophageal reflux disease without esophagitis 05/06/2018   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 Authorizing Provider  doxycycline (VIBRAMYCIN) 100 MG capsule Take 1 capsule (100 mg total) by mouth 2 (two) times daily for 7 days. 04/25/21 05/02/21  Rhys Martini, PA-C  metroNIDAZOLE (FLAGYL) 500 MG tablet Take 1 tablet (500 mg total) by mouth 2 (two) times daily. 04/25/21   Rhys Martini, PA-C  ondansetron (ZOFRAN) 8 MG tablet Take 1 tablet (8 mg total) by mouth every 8 (eight) hours as needed for nausea or vomiting. 02/18/21   Rolm Bookbinder, CNM  oxyCODONE-acetaminophen (PERCOCET/ROXICET) 5-325 MG tablet Take 1 tablet by mouth every 6 (six) hours as needed for severe pain. 02/20/21   Calvert Cantor, CNM  hyoscyamine (ANASPAZ) 0.125 MG TBDP disintergrating tablet Place 1 tablet (0.125 mg total) under the tongue every 6 (six) hours as needed  for cramping. Patient not taking: No sig reported 09/07/20 02/26/21  Aviva Signs, CNM    Family History History reviewed. No pertinent family history.  Social History Social History   Tobacco Use   Smoking status: Former    Pack years: 0.00    Types: Cigarettes   Smokeless tobacco: Never   Tobacco comments:    not currently  Vaping Use   Vaping Use: Never used  Substance Use Topics   Alcohol use: Not Currently   Drug use: Not Currently     Allergies   Ibuprofen, Mushroom extract complex, Prochlorperazine, Toradol [ketorolac tromethamine], Ketorolac, and Niacin and  related   Review of Systems Review of Systems  Constitutional:  Negative for appetite change, chills, diaphoresis and fever.  Respiratory:  Negative for shortness of breath.   Cardiovascular:  Negative for chest pain.  Gastrointestinal:  Negative for abdominal pain, blood in stool, constipation, diarrhea, nausea and vomiting.  Genitourinary:  Positive for vaginal discharge. Negative for decreased urine volume, difficulty urinating, dysuria, flank pain, frequency, genital sores, hematuria and urgency.  Musculoskeletal:  Negative for back pain.  Neurological:  Negative for dizziness, weakness and light-headedness.  All other systems reviewed and are negative.   Physical Exam Triage Vital Signs ED Triage Vitals [04/25/21 1919]  Enc Vitals Group     BP (!) 122/95     Pulse Rate 88     Resp 18     Temp 99.5 F (37.5 C)     Temp src      SpO2 100 %     Weight      Height      Head Circumference      Peak Flow      Pain Score 2     Pain Loc      Pain Edu?      Excl. in GC?    No data found.  Updated Vital Signs BP (!) 122/95   Pulse 88   Temp 99.5 F (37.5 C)   Resp 18   LMP 04/09/2021   SpO2 100%   Breastfeeding No   Visual Acuity Right Eye Distance:   Left Eye Distance:   Bilateral Distance:    Right Eye Near:   Left Eye Near:    Bilateral Near:     Physical Exam Vitals reviewed.  Constitutional:      General: She is not in acute distress.    Appearance: Normal appearance. She is not ill-appearing.  HENT:     Head: Normocephalic and atraumatic.     Ears:     Comments: R external canal with 52mm round pustule, tender. TM visible and appears healthy.    Mouth/Throat:     Mouth: Mucous membranes are moist.     Comments: Moist mucous membranes Eyes:     Extraocular Movements: Extraocular movements intact.     Pupils: Pupils are equal, round, and reactive to light.  Cardiovascular:     Rate and Rhythm: Normal rate and regular rhythm.     Heart sounds:  Normal heart sounds.  Pulmonary:     Effort: Pulmonary effort is normal.     Breath sounds: Normal breath sounds. No wheezing, rhonchi or rales.  Abdominal:     General: Bowel sounds are normal. There is no distension.     Palpations: Abdomen is soft. There is no mass.     Tenderness: There is no abdominal tenderness. There is no right CVA tenderness, left CVA tenderness, guarding or rebound.  Genitourinary:    Comments: deferred Skin:    General: Skin is warm.     Capillary Refill: Capillary refill takes less than 2 seconds.     Comments: Good skin turgor  Neurological:     General: No focal deficit present.     Mental Status: She is alert and oriented to person, place, and time.  Psychiatric:        Mood and Affect: Mood normal.        Behavior: Behavior normal.     UC Treatments / Results  Labs (all labs ordered are listed, but only abnormal results are displayed) Labs Reviewed  POCT URINALYSIS DIPSTICK, ED / UC - Abnormal; Notable for the following components:      Result Value   Leukocytes,Ua SMALL (*)    All other components within normal limits  POC URINE PREG, ED  CERVICOVAGINAL ANCILLARY ONLY    EKG   Radiology No results found.  Procedures Procedures (including critical care time)  Medications Ordered in UC Medications - No data to display  Initial Impression / Assessment and Plan / UC Course  I have reviewed the triage vital signs and the nursing notes.  Pertinent labs & imaging results that were available during my care of the patient were reviewed by me and considered in my medical decision making (see chart for details).     This patient is a very pleasant 30 y.o. year old female presenting with presumed BV and pustule R external auditory canal. Afebrile, nontachy, no abd pain or CVAT on exam.   UA with small leuk, otherwise wnl. Did not send culture.  Urine pregnancy negative. States she is not pregnant or breastfeeding. Flagyl sent for  presumed BV, will also test for yeast, gonorrhea, chlamydia, trichomonas.  She denies STI risk. Doxycycline sent for pustule in right external auditory canal.  ED return precautions discussed. Patient verbalizes understanding and agreement.    Final Clinical Impressions(s) / UC Diagnoses   Final diagnoses:  Vaginitis and vulvovaginitis  Skin pustule     Discharge Instructions      -For bacterial vaginosis, start the antibiotic-Flagyl (metronidazole), 2 pills daily for 7 days.  You can take this with food if you have a sensitive stomach.  Avoid alcohol while taking this medication and for 2 days after as this will cause severe nausea and vomiting. -Doxycycline twice daily for 7 days.  Make sure to wear sunscreen while spending time outside while on this medication as it can increase your chance of sunburn. You can take this medication with food if you have a sensitive stomach. -We're testing you for BV and yeast. We'll call you if anything else is positive.       ED Prescriptions     Medication Sig Dispense Auth. Provider   metroNIDAZOLE (FLAGYL) 500 MG tablet Take 1 tablet (500 mg total) by mouth 2 (two) times daily. 14 tablet Rhys Martini, PA-C   doxycycline (VIBRAMYCIN) 100 MG capsule Take 1 capsule (100 mg total) by mouth 2 (two) times daily for 7 days. 14 capsule Rhys Martini, PA-C      PDMP not reviewed this encounter.   Rhys Martini, PA-C 04/25/21 2044

## 2021-04-25 NOTE — ED Triage Notes (Signed)
PT reports vag discharge with itching.

## 2021-04-25 NOTE — Discharge Instructions (Addendum)
-  For bacterial vaginosis, start the antibiotic-Flagyl (metronidazole), 2 pills daily for 7 days.  You can take this with food if you have a sensitive stomach.  Avoid alcohol while taking this medication and for 2 days after as this will cause severe nausea and vomiting. -Doxycycline twice daily for 7 days.  Make sure to wear sunscreen while spending time outside while on this medication as it can increase your chance of sunburn. You can take this medication with food if you have a sensitive stomach. -We're testing you for BV and yeast. We'll call you if anything else is positive.

## 2021-04-27 LAB — CERVICOVAGINAL ANCILLARY ONLY
Bacterial Vaginitis (gardnerella): POSITIVE — AB
Candida Glabrata: NEGATIVE
Candida Vaginitis: POSITIVE — AB
Chlamydia: NEGATIVE
Comment: NEGATIVE
Comment: NEGATIVE
Comment: NEGATIVE
Comment: NEGATIVE
Comment: NEGATIVE
Comment: NORMAL
Neisseria Gonorrhea: NEGATIVE
Trichomonas: NEGATIVE

## 2021-05-02 ENCOUNTER — Telehealth (HOSPITAL_COMMUNITY): Payer: Self-pay | Admitting: Emergency Medicine

## 2021-05-02 MED ORDER — FLUCONAZOLE 150 MG PO TABS: 150.0000 mg | ORAL_TABLET | Freq: Once | ORAL | 0 refills | Status: AC

## 2021-05-02 MED ORDER — TERCONAZOLE 0.4 % VA CREA: 1.0000 | TOPICAL_CREAM | Freq: Every day | VAGINAL | 0 refills | Status: AC

## 2021-07-07 ENCOUNTER — Other Ambulatory Visit: Payer: Self-pay

## 2021-07-07 ENCOUNTER — Encounter (HOSPITAL_COMMUNITY): Payer: Self-pay

## 2021-07-07 ENCOUNTER — Inpatient Hospital Stay (HOSPITAL_COMMUNITY): Payer: Medicaid Other

## 2021-07-07 ENCOUNTER — Inpatient Hospital Stay (HOSPITAL_COMMUNITY)
Admission: EM | Admit: 2021-07-07 | Discharge: 2021-07-07 | Disposition: A | Discharge status: Home / Self Care | Payer: Medicaid Other | Attending: Obstetrics and Gynecology | Admitting: Obstetrics and Gynecology

## 2021-07-07 DIAGNOSIS — R519 Headache, unspecified: Secondary | ICD-10-CM | POA: Insufficient documentation

## 2021-07-07 DIAGNOSIS — R109 Unspecified abdominal pain: Secondary | ICD-10-CM

## 2021-07-07 DIAGNOSIS — O26891 Other specified pregnancy related conditions, first trimester: Secondary | ICD-10-CM

## 2021-07-07 DIAGNOSIS — O99891 Other specified diseases and conditions complicating pregnancy: Secondary | ICD-10-CM | POA: Diagnosis not present

## 2021-07-07 DIAGNOSIS — Z3A12 12 weeks gestation of pregnancy: Secondary | ICD-10-CM

## 2021-07-07 DIAGNOSIS — R1011 Right upper quadrant pain: Secondary | ICD-10-CM | POA: Insufficient documentation

## 2021-07-07 DIAGNOSIS — O26899 Other specified pregnancy related conditions, unspecified trimester: Secondary | ICD-10-CM

## 2021-07-07 DIAGNOSIS — M549 Dorsalgia, unspecified: Secondary | ICD-10-CM | POA: Diagnosis not present

## 2021-07-07 LAB — COMPREHENSIVE METABOLIC PANEL
ALT: 9 U/L (ref 0–44)
AST: 12 U/L — ABNORMAL LOW (ref 15–41)
Albumin: 3.4 g/dL — ABNORMAL LOW (ref 3.5–5.0)
Alkaline Phosphatase: 68 U/L (ref 38–126)
Anion gap: 10 (ref 5–15)
BUN: 11 mg/dL (ref 6–20)
CO2: 24 mmol/L (ref 22–32)
Calcium: 9.2 mg/dL (ref 8.9–10.3)
Chloride: 103 mmol/L (ref 98–111)
Creatinine, Ser: 0.76 mg/dL (ref 0.44–1.00)
GFR, Estimated: >60 mL/min (ref >60)
Glucose, Bld: 100 mg/dL — ABNORMAL HIGH (ref 70–99)
Potassium: 3.5 mmol/L (ref 3.5–5.1)
Sodium: 137 mmol/L (ref 135–145)
Total Bilirubin: 0.3 mg/dL (ref 0.3–1.2)
Total Protein: 6.7 g/dL (ref 6.5–8.1)

## 2021-07-07 LAB — URINALYSIS, ROUTINE W REFLEX MICROSCOPIC
Bilirubin Urine: NEGATIVE
Glucose, UA: NEGATIVE mg/dL
Hgb urine dipstick: NEGATIVE
Ketones, ur: NEGATIVE mg/dL
Nitrite: NEGATIVE
Protein, ur: NEGATIVE mg/dL
Specific Gravity, Urine: 1.025 (ref 1.005–1.030)
pH: 6 (ref 5.0–8.0)

## 2021-07-07 LAB — CBC
HCT: 37.5 % (ref 36.0–46.0)
Hemoglobin: 12.7 g/dL (ref 12.0–15.0)
MCH: 29.7 pg (ref 26.0–34.0)
MCHC: 33.9 g/dL (ref 30.0–36.0)
MCV: 87.8 fL (ref 80.0–100.0)
Platelets: 219 10*3/uL (ref 150–400)
RBC: 4.27 MIL/uL (ref 3.87–5.11)
RDW: 12.6 % (ref 11.5–15.5)
WBC: 9.3 10*3/uL (ref 4.0–10.5)
nRBC: 0 % (ref 0.0–0.2)

## 2021-07-07 LAB — WET PREP, GENITAL
Clue Cells Wet Prep HPF POC: NONE SEEN
Sperm: NONE SEEN
Trich, Wet Prep: NONE SEEN
Yeast Wet Prep HPF POC: NONE SEEN

## 2021-07-07 LAB — LIPASE, BLOOD: Lipase: 29 U/L (ref 11–51)

## 2021-07-07 LAB — I-STAT BETA HCG BLOOD, ED (MC, WL, AP ONLY): I-stat hCG, quantitative: >2000 m[IU]/mL — ABNORMAL HIGH (ref <5)

## 2021-07-07 MED ORDER — CYCLOBENZAPRINE HCL 5 MG PO TABS
10.0000 mg | ORAL_TABLET | Freq: Once | ORAL | Status: AC
  Administered 2021-07-07: 10 mg via ORAL
  Filled 2021-07-07: qty 2

## 2021-07-07 MED ORDER — ACETAMINOPHEN 500 MG PO TABS
1000.0000 mg | ORAL_TABLET | Freq: Once | ORAL | Status: AC
  Administered 2021-07-07: 1000 mg via ORAL
  Filled 2021-07-07: qty 2

## 2021-07-07 NOTE — MAU Note (Signed)
Pt instructed in obtaining vag swabs which she did without difficulty. 

## 2021-07-07 NOTE — ED Notes (Signed)
Report called to MAU. Transport notified. 

## 2021-07-07 NOTE — ED Provider Notes (Signed)
Emergency Medicine Provider OB Triage Evaluation Note  Taylor Hodge is a 30 y.o. female, [id number], at [redacted]w[redacted]d gestation who presents to the emergency department with complaints of headache, neck pain, back pain, and abdominal pain.  No alleviating or grading factors.  Review of  Systems  Positive: Headache, neck pain, back pain, and abdominal pain Negative: Vaginal bleeding, dysuria.   Physical Exam  BP (!) 127/95   Pulse 92   Temp 99.3 F (37.4 C) (Oral)   Resp 16   LMP 01/15/2021   SpO2 96%  General: Awake, no distress  HEENT: Atraumatic  Resp: Normal effort  Cardiac: Normal rate Abd: mild RUQ tenderness.  MSK: Moves all extremities without difficulty Neuro: Speech clear. PERRL. EOMI.   Medical Decision Making  Pt evaluated for pregnancy concern and is stable for transfer to MAU. Pt is in agreement with plan for transfer.  3:43 AM Discussed with MAU APP, [name], who accepts patient in transfer.  Clinical Impression   1. Pregnancy headache in first trimester   2. Abdominal pain during pregnancy   3. [redacted] weeks gestation of pregnancy   4. Back pain in pregnancy        [name], PA-C 07/07/21 0700    Tilden Fossa, MD 07/07/21 [phone]

## 2021-07-07 NOTE — MAU Note (Addendum)
Having back pain for couple days. Have uti but only took half of script as 30yo threw them out the window. More prescribed but has not picked up yet. Headache since Friday am. Tylenol not helping. Does have hx headaches with pregnancies but this h/a is much worse. Neck is stiff and sore.  Also having pain in upper R abdomen and bil flanks. Denies VB or d/c

## 2021-07-07 NOTE — ED Triage Notes (Signed)
Pt states she is [redacted]wks pregnant and has had a headache, neck pain and abdominal pain since yesterday morning.  Pt has had nausea.

## 2021-07-07 NOTE — Progress Notes (Signed)
Written and verbal d/c instructions given and understanding voiced. 

## 2021-07-07 NOTE — MAU Provider Note (Signed)
History     CSN: [id number]  Arrival date and time: 07/07/21 0204   Event Date/Time   First Provider Initiated Contact with Patient 07/07/21 [phone]      Chief Complaint  Patient presents with   Abdominal Pain   Back Pain   Headache   Taylor Hodge is a 30 y.o. [id number] at [redacted]w[redacted]d who receives care at [facility] in [facility].  She presents today for Abdominal Pain, Back Pain, Neck Pain, and Headache.  She states her symptoms started today. She states the neck pain and headache started this morning around the same time.  She reports the HA is a 12/10, is located in the frontal area, and is worsened by light.  However, she denies any relieving factors despite having tried tylenol and caffeine.  She reports the neck pain is a 6/10.  The abdominal pain is located in the RUQ and she contributes it to undiagnosed IBS. She reports some nausea, but no vomiting.  She reports concern for continued UTI as she states she was recently diagnosed with one, but did not complete the medication.       OB History     Gravida  11   Para  5   Term  1   Preterm  4   AB  3   Living  6      SAB  2   IAB  1   Ectopic      Multiple      Live Births  5           Past Medical History:  Diagnosis Date   Anemia    Anxiety    Seizures (HCC)     Past Surgical History:  Procedure Laterality Date   CESAREAN SECTION     EYE SURGERY      History reviewed. No pertinent family history.  Social History   Tobacco Use   Smoking status: Former    Types: Cigarettes   Smokeless tobacco: Never   Tobacco comments:    not currently  Vaping Use   Vaping Use: Never used  Substance Use Topics   Alcohol use: Not Currently   Drug use: Not Currently    Allergies:  Allergies  Allergen Reactions   Ibuprofen Hives   Mushroom Extract Complex Anaphylaxis   Prochlorperazine Anxiety, Anaphylaxis and Hives    hives   Toradol [Ketorolac Tromethamine] Shortness Of Breath   Ketorolac Itching  and Rash   Niacin And Related Other (See Comments)    Flushing, hot flashes    Medications Prior to Admission  Medication Sig Dispense Refill Last Dose   folic acid (FOLVITE) 1 MG tablet Take 1 mg by mouth daily.   07/06/2021 at ma   lamoTRIgine (LAMICTAL) 150 MG tablet Take 150 mg by mouth 2 (two) times daily.   07/06/2021   magnesium gluconate (MAGONATE) 500 MG tablet Take 500 mg by mouth 2 (two) times daily.      ondansetron (ZOFRAN) 8 MG tablet Take 1 tablet (8 mg total) by mouth every 8 (eight) hours as needed for nausea or vomiting. 20 tablet 0 Past Week   metroNIDAZOLE (FLAGYL) 500 MG tablet Take 1 tablet (500 mg total) by mouth 2 (two) times daily. 14 tablet 0    oxyCODONE-acetaminophen (PERCOCET/ROXICET) 5-325 MG tablet Take 1 tablet by mouth every 6 (six) hours as needed for severe pain. 20 tablet 0     Review of Systems  Constitutional:  Negative for chills and fever.  Gastrointestinal:  Positive for abdominal pain, diarrhea and nausea. Negative for constipation and vomiting.  Genitourinary:  Negative for difficulty urinating, dysuria, vaginal bleeding and vaginal discharge.  Musculoskeletal:  Positive for back pain.  Neurological:  Positive for headaches. Negative for dizziness and light-headedness.  Physical Exam   Blood pressure 114/84, pulse 96, temperature 97.8 F (36.6 C), resp. rate 18, height 5' (1.524 m), weight 68.5 kg, last menstrual period 04/12/2021, SpO2 96 %.  Physical Exam Vitals reviewed.  Constitutional:      Appearance: She is well-developed.  HENT:     Head: Normocephalic and atraumatic.  Eyes:     Conjunctiva/sclera: Conjunctivae normal.  Cardiovascular:     Rate and Rhythm: Normal rate and regular rhythm.     Heart sounds: Normal heart sounds.  Pulmonary:     Effort: Pulmonary effort is normal. No respiratory distress.     Breath sounds: Normal breath sounds.  Abdominal:     General: Abdomen is flat.     Palpations: Abdomen is soft.      Tenderness: There is no abdominal tenderness.  Musculoskeletal:        General: Normal range of motion.     Cervical back: Normal range of motion.  Skin:    General: Skin is warm and dry.  Neurological:     Mental Status: She is alert and oriented to person, place, and time.  Psychiatric:        Mood and Affect: Mood normal.        Behavior: Behavior normal.    MAU Course  Procedures Results for orders placed or performed during the hospital encounter of 07/07/21 (from the past 24 hour(s))  Lipase, blood     Status: None   Collection Time: 07/07/21  2:31 AM  Result Value Ref Range   Lipase 29 11 - 51 U/L  Comprehensive metabolic panel     Status: Abnormal   Collection Time: 07/07/21  2:31 AM  Result Value Ref Range   Sodium 137 135 - 145 mmol/L   Potassium 3.5 3.5 - 5.1 mmol/L   Chloride 103 98 - 111 mmol/L   CO2 24 22 - 32 mmol/L   Glucose, Bld 100 (H) 70 - 99 mg/dL   BUN 11 6 - 20 mg/dL   Creatinine, Ser 1.32 0.44 - 1.00 mg/dL   Calcium 9.2 8.9 - 44.0 mg/dL   Total Protein 6.7 6.5 - 8.1 g/dL   Albumin 3.4 (L) 3.5 - 5.0 g/dL   AST 12 (L) 15 - 41 U/L   ALT 9 0 - 44 U/L   Alkaline Phosphatase 68 38 - 126 U/L   Total Bilirubin 0.3 0.3 - 1.2 mg/dL   GFR, Estimated >10 >27 mL/min   Anion gap 10 5 - 15  CBC     Status: None   Collection Time: 07/07/21  2:31 AM  Result Value Ref Range   WBC 9.3 4.0 - 10.5 K/uL   RBC 4.27 3.87 - 5.11 MIL/uL   Hemoglobin 12.7 12.0 - 15.0 g/dL   HCT 25.3 66.4 - 40.3 %   MCV 87.8 80.0 - 100.0 fL   MCH 29.7 26.0 - 34.0 pg   MCHC 33.9 30.0 - 36.0 g/dL   RDW 47.4 25.9 - 56.3 %   Platelets 219 150 - 400 K/uL   nRBC 0.0 0.0 - 0.2 %  I-Stat beta hCG blood, ED     Status: Abnormal   Collection Time: 07/07/21  3:01 AM  Result Value  Ref Range   I-stat hCG, quantitative >2,000.0 (H) <5 mIU/mL   Comment 3          Wet prep, genital     Status: Abnormal   Collection Time: 07/07/21  4:06 AM   Specimen: PATH Cytology Cervicovaginal Ancillary Only   Result Value Ref Range   Yeast Wet Prep HPF POC NONE SEEN NONE SEEN   Trich, Wet Prep NONE SEEN NONE SEEN   Clue Cells Wet Prep HPF POC NONE SEEN NONE SEEN   WBC, Wet Prep HPF POC FEW (A) NONE SEEN   Sperm NONE SEEN   Urinalysis, Routine w reflex microscopic PATH Cytology Cervicovaginal Ancillary Only     Status: Abnormal   Collection Time: 07/07/21  4:15 AM  Result Value Ref Range   Color, Urine YELLOW YELLOW   APPearance HAZY (A) CLEAR   Specific Gravity, Urine 1.025 1.005 - 1.030   pH 6.0 5.0 - 8.0   Glucose, UA NEGATIVE NEGATIVE mg/dL   Hgb urine dipstick NEGATIVE NEGATIVE   Bilirubin Urine NEGATIVE NEGATIVE   Ketones, ur NEGATIVE NEGATIVE mg/dL   Protein, ur NEGATIVE NEGATIVE mg/dL   Nitrite NEGATIVE NEGATIVE   Leukocytes,Ua TRACE (A) NEGATIVE   WBC, UA 0-5 0 - 5 WBC/hpf   Bacteria, UA RARE (A) NONE SEEN   Squamous Epithelial / LPF 0-5 0 - 5   Mucus PRESENT    US OB Comp Less 14 Wks  Result Date: 07/07/2021 CLINICAL DATA:  Abdominal pain and first-trimester pregnancy EXAM: OBSTETRIC <14 WK ULTRASOUND TECHNIQUE: Transabdominal ultrasound was performed for evaluation of the gestation as well as the maternal uterus and adnexal regions. COMPARISON:  01/03/2021 FINDINGS: Intrauterine gestational sac: Single Yolk sac:  Not Visualized. Embryo:  Visualized. Cardiac Activity: Visualized. Heart Rate: 153 bpm CRL:   67.9 mm   13 w 0 d                  [place] EDC: 01/12/2022 Subchorionic hemorrhage:  None visualized. Maternal uterus/adnexae: Unremarkable appearance of the adnexa and pelvic recesses. IMPRESSION: Single living intrauterine pregnancy measuring 13 weeks. No pathologic finding. Electronically Signed   By: Tiburcio Pea M.D.   On: 07/07/2021 05:08    MDM Pelvic Exam; Wet Prep and GC/CT Labs: UA, UPT, CBC, hCG, ABO Ultrasound   Assessment and Plan  30 year old, [id number]  SIUP at 12.2 weeks Headache Abdominal Pain Back Pain  -Orders placed with patient in triage. -Results  as above.  -Informed that UA not suggestive of infection, but should take medication as prescribed by primary md. -Discussed usage of flexeril and tylenol for HA as well as back pain. -Patient agreeable. -Cultures collected by self-swab.  -Will return to reassess.   Cherre Robins 07/07/2021, 5:03 AM   Reassessment (5:33 AM)  -Patient requests discharge despite no improvement in headache. -Provider offers patient additional interventions including IV infusion. -Patient declines stating she would like to go home and sleep. -Patient requests note stating she was present today.  Further requests note for excusal for work tomorrow. -Informed that note for today will be given, but additional excusal would have to be obtained from primary ob. -Encouraged to call primary office or return to MAU if symptoms worsen or with the onset of new symptoms. -Discharged to home in stable condition.  Cherre Robins MSN, CNM Advanced Practice Provider, [facility] [employer]

## 2021-07-09 LAB — GC/CHLAMYDIA PROBE AMP (~~LOC~~) NOT AT ARMC
Chlamydia: NEGATIVE
Comment: NEGATIVE
Comment: NORMAL
Neisseria Gonorrhea: NEGATIVE

## 2021-07-15 IMAGING — CT CT ABD-PELV W/ CM
2 of 4 series · 16 of 46 positions shown, 18 images · IV contrast (omnipaque)
Comparison: CT the abdomen and pelvis 01/16/2020.

CLINICAL DATA: 30-year-old female with history of abdominal
distension.

EXAM:
CT ABDOMEN AND PELVIS WITH CONTRAST
TECHNIQUE: Multidetector CT imaging of the abdomen and pelvis was performed
using the standard protocol following bolus administration of
intravenous contrast.
CONTRAST:  100mL OMNIPAQUE IOHEXOL 300 MG/ML  SOLN

[Series 3: a/p w/ 5mm · axial · 0.88mm/px · z∈[+714,+1110]mm · 13 of 87 slices shown, 15 images]
[im 4/87  soft-tissue]
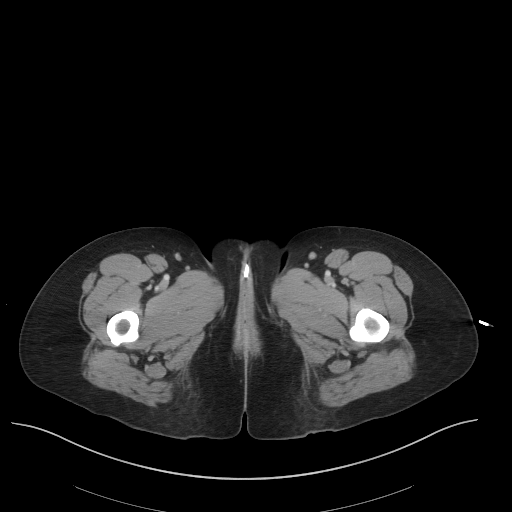
[im 4/87  bone]
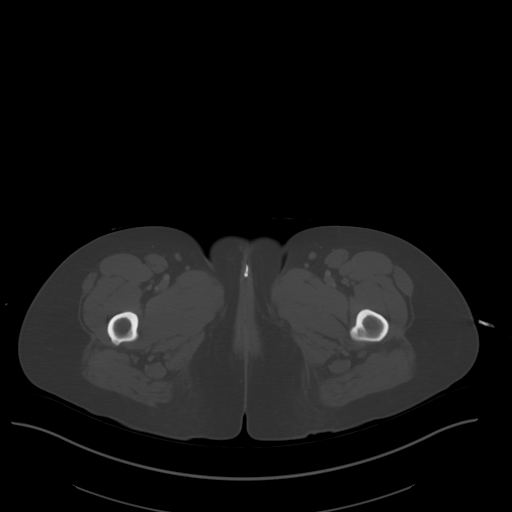
[im 11/87  soft-tissue]
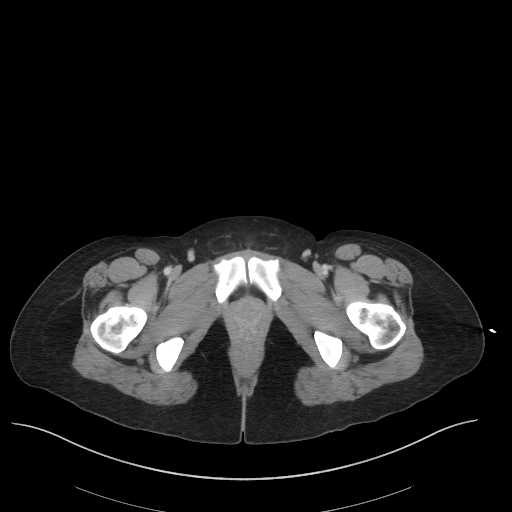
[im 18/87  soft-tissue]
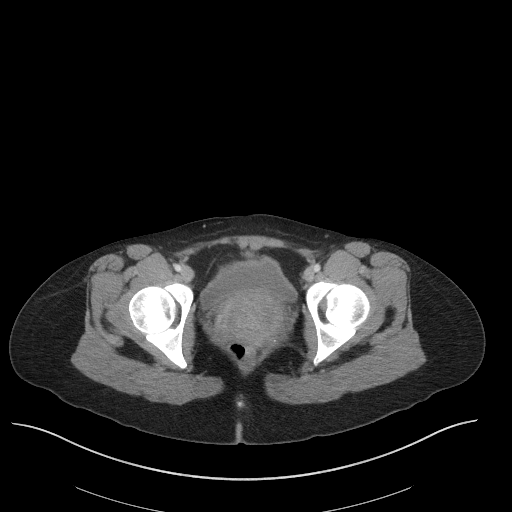
[im 25/87  soft-tissue]
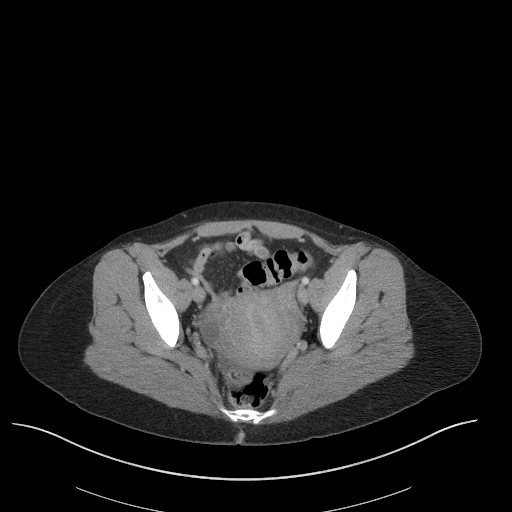
[im 31/87  soft-tissue]
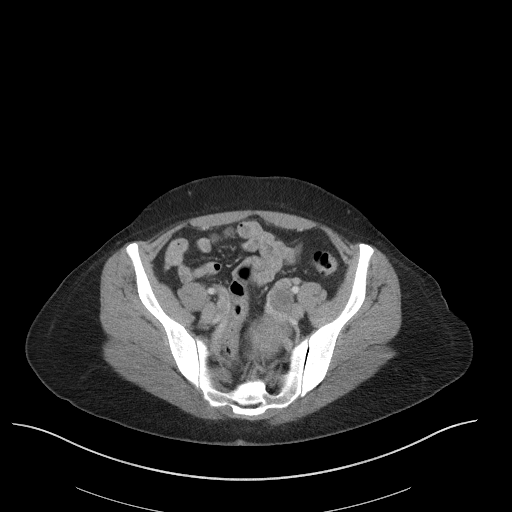
[im 38/87  soft-tissue]
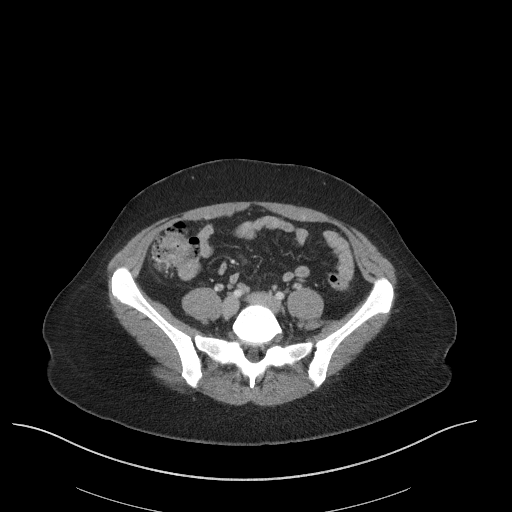
[im 45/87  soft-tissue]
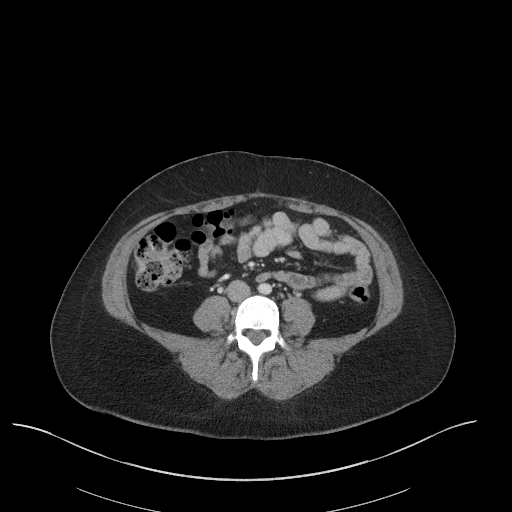
[im 49/87  soft-tissue]
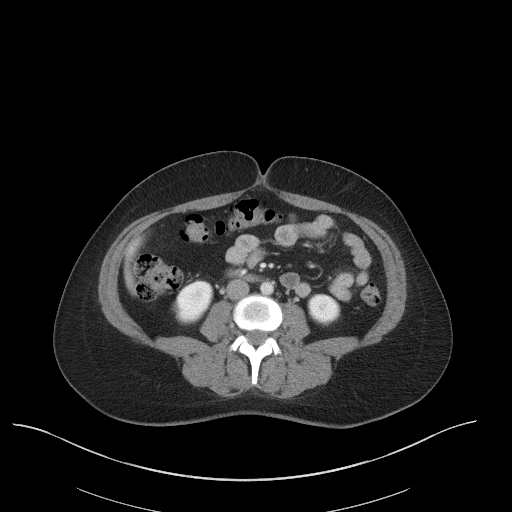
[im 56/87  soft-tissue]
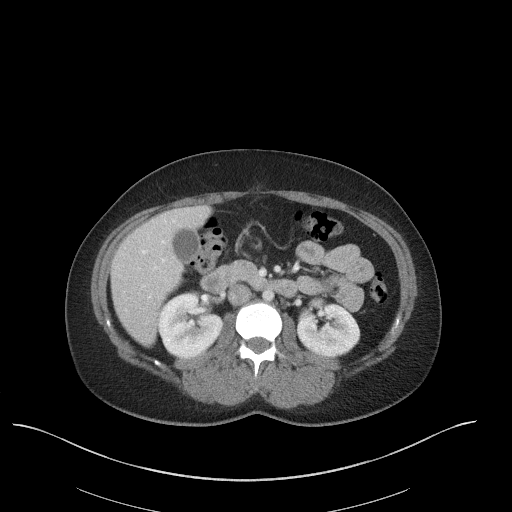
[im 56/87  bone]
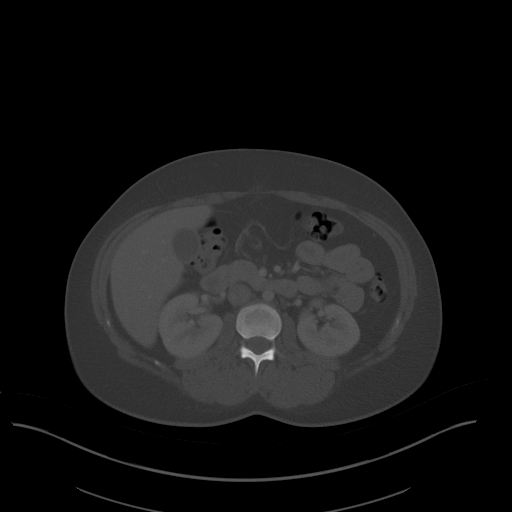
[im 62/87  soft-tissue]
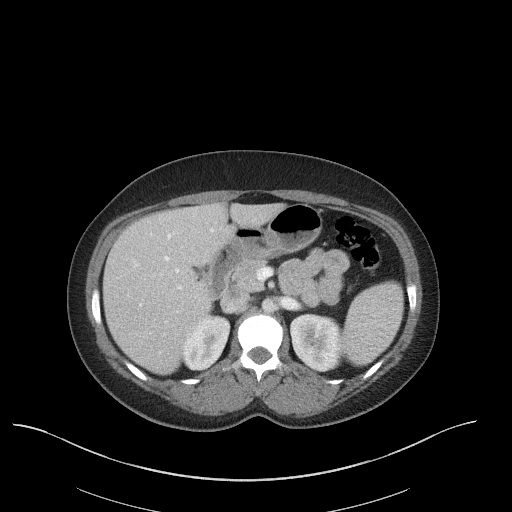
[im 69/87  soft-tissue]
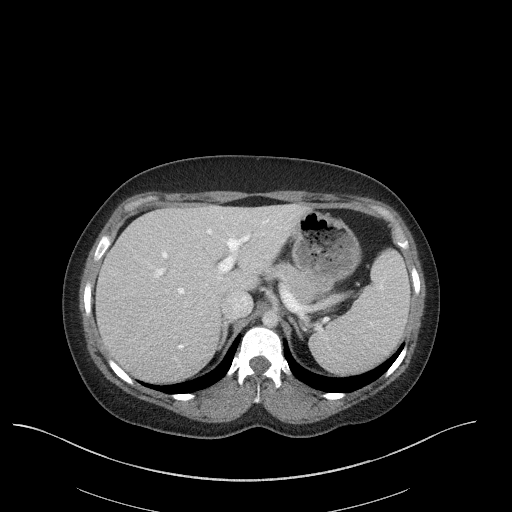
[im 76/87  soft-tissue]
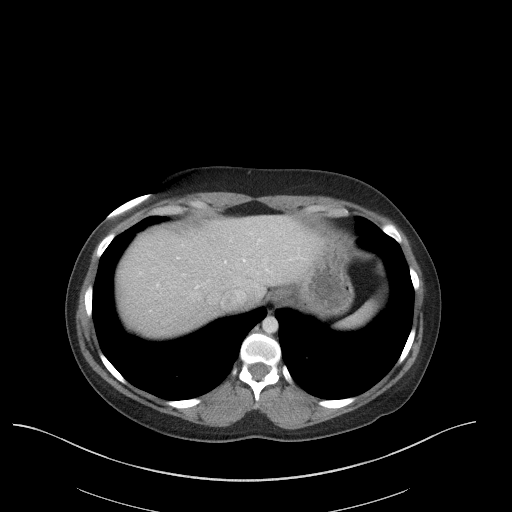
[im 83/87  soft-tissue]
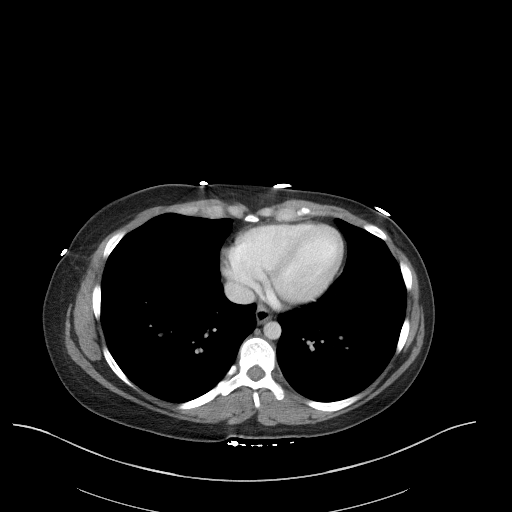

[Series 6: a/p w/ cor · coronal · 0.77mm/px · 3 of 151 slices shown]
[im 51/151  soft-tissue]
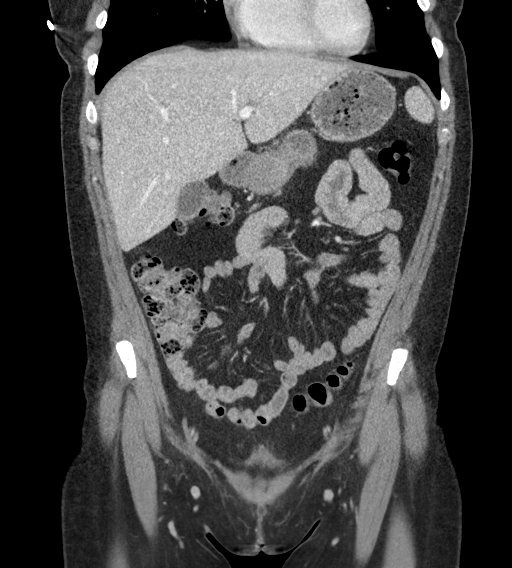
[im 67/151  soft-tissue]
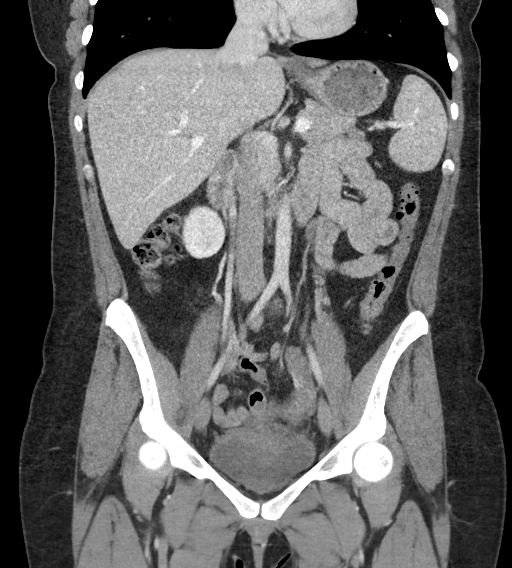
[im 84/151  soft-tissue]
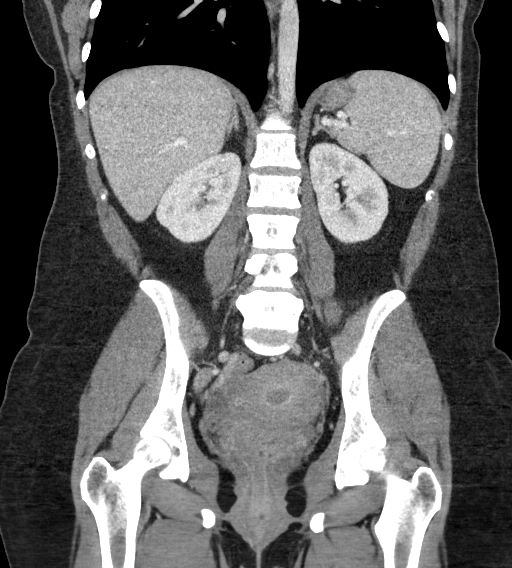

[16 of 46 positions shown; findings below may reference images not displayed]

FINDINGS: Lower chest: Unremarkable.

Hepatobiliary: No suspicious cystic or solid hepatic lesions. No
intra or extrahepatic biliary ductal dilatation. Gallbladder is
normal in appearance.

Pancreas: No pancreatic mass. No pancreatic ductal dilatation. No
pancreatic or peripancreatic fluid collections or inflammatory
changes.

Spleen: Unremarkable.

Adrenals/Urinary Tract: Bilateral kidneys and bilateral adrenal
glands are normal in appearance. No hydroureteronephrosis. Urinary
bladder is normal in appearance.

Stomach/Bowel: Normal appearance of the stomach. No pathologic
dilatation of small bowel or colon. Normal appendix.

Vascular/Lymphatic: No significant atherosclerotic disease, aneurysm
or dissection noted in the abdominal or pelvic vasculature. No
lymphadenopathy noted in the abdomen or pelvis.

Reproductive: Retroverted and retroflexed uterus. Ovaries are
unremarkable in appearance.

Other: No significant volume of ascites.  No pneumoperitoneum.

Musculoskeletal: There are no aggressive appearing lytic or blastic
lesions noted in the visualized portions of the skeleton.
IMPRESSION: 1. No acute findings are noted in the abdomen or pelvis to account
for the patient's symptoms.

## 2021-08-22 ENCOUNTER — Inpatient Hospital Stay (HOSPITAL_COMMUNITY)
Admission: AD | Admit: 2021-08-22 | Discharge: 2021-08-23 | Disposition: A | Discharge status: Home / Self Care | Payer: Medicaid Other | Attending: Obstetrics and Gynecology | Admitting: Obstetrics and Gynecology

## 2021-08-22 ENCOUNTER — Encounter (HOSPITAL_COMMUNITY): Payer: Self-pay | Admitting: Obstetrics and Gynecology

## 2021-08-22 DIAGNOSIS — M7918 Myalgia, other site: Secondary | ICD-10-CM

## 2021-08-22 DIAGNOSIS — W010XXA Fall on same level from slipping, tripping and stumbling without subsequent striking against object, initial encounter: Secondary | ICD-10-CM

## 2021-08-22 DIAGNOSIS — Y9354 Activity, bowling: Secondary | ICD-10-CM | POA: Insufficient documentation

## 2021-08-22 DIAGNOSIS — Z3A18 18 weeks gestation of pregnancy: Secondary | ICD-10-CM

## 2021-08-22 DIAGNOSIS — O99891 Other specified diseases and conditions complicating pregnancy: Secondary | ICD-10-CM

## 2021-08-22 DIAGNOSIS — O26892 Other specified pregnancy related conditions, second trimester: Secondary | ICD-10-CM | POA: Insufficient documentation

## 2021-08-22 DIAGNOSIS — W1839XA Other fall on same level, initial encounter: Secondary | ICD-10-CM | POA: Insufficient documentation

## 2021-08-22 DIAGNOSIS — W19XXXA Unspecified fall, initial encounter: Secondary | ICD-10-CM

## 2021-08-22 DIAGNOSIS — M549 Dorsalgia, unspecified: Secondary | ICD-10-CM | POA: Insufficient documentation

## 2021-08-22 DIAGNOSIS — R109 Unspecified abdominal pain: Secondary | ICD-10-CM | POA: Insufficient documentation

## 2021-08-22 NOTE — MAU Note (Signed)
..  Taylor Hodge is a 30 y.o. here in MAU reporting: Patient had a fall on Monday while bowling, she fell on her side/back and did not hit her abdomen. Has been having some leg pain after it.  Today she has had right-sided sharp abdominal and back pain that is intermittent. Reports some vaginal spotting only when she wipes.  Pain score: 9/10 abdominal; 8/10 back  Vitals:   08/22/21 2304  BP: 112/85  Pulse: (!) 106  Resp: 17  Temp: 98.4 F (36.9 C)  SpO2: 100%     FHT: 155 Lab orders placed from triage: UA

## 2021-08-22 NOTE — MAU Provider Note (Signed)
History     CSN: [id number]  Arrival date and time: 08/22/21 2244   Event Date/Time   First Provider Initiated Contact with Patient 08/22/21 2333      Chief Complaint  Patient presents with   Abdominal Pain   Back Pain   Fall   HPI Taylor Hodge is a 30 y.o. [id number] at [redacted] weeks gestation who presents with pain and vaginal bleeding. She [place] and has had pain in the right side of her abdomen since then. She rates the pain a 5/10 and has not tried anything for the pain because she doesn't like taking medications. She reports spotting for the last 2 days that resolved this am. She denies any abnormal discharge or leaking of fluid.   OB History     Gravida  10   Para  6   Term  2   Preterm  4   AB  3   Living  6      SAB  2   IAB  1   Ectopic      Multiple      Live Births  6           Past Medical History:  Diagnosis Date   Anemia    Anxiety    Seizures (HCC)     Past Surgical History:  Procedure Laterality Date   CESAREAN SECTION     EYE SURGERY      No family history on file.  Social History   Tobacco Use   Smoking status: Former    Types: Cigarettes   Smokeless tobacco: Never   Tobacco comments:    not currently  Vaping Use   Vaping Use: Never used  Substance Use Topics   Alcohol use: Not Currently   Drug use: Not Currently    Allergies:  Allergies  Allergen Reactions   Ibuprofen Hives   Mushroom Extract Complex Anaphylaxis   Prochlorperazine Anxiety, Anaphylaxis and Hives    hives   Toradol [Ketorolac Tromethamine] Shortness Of Breath   Ketorolac Itching and Rash   Niacin And Related Other (See Comments)    Flushing, hot flashes    Medications Prior to Admission  Medication Sig Dispense Refill Last Dose   folic acid (FOLVITE) 1 MG tablet Take 1 mg by mouth daily.   08/22/2021   lamoTRIgine (LAMICTAL) 150 MG tablet Take 150 mg by mouth 2 (two) times daily.   08/22/2021    magnesium gluconate (MAGONATE) 500 MG tablet Take 500 mg by mouth 2 (two) times daily.   08/22/2021   ondansetron (ZOFRAN) 8 MG tablet Take 1 tablet (8 mg total) by mouth every 8 (eight) hours as needed for nausea or vomiting. 20 tablet 0 08/22/2021    Review of Systems  Constitutional: Negative.  Negative for fatigue and fever.  HENT: Negative.    Respiratory: Negative.  Negative for shortness of breath.   Cardiovascular: Negative.  Negative for chest pain.  Gastrointestinal:  Positive for abdominal pain. Negative for constipation, diarrhea, nausea and vomiting.  Genitourinary:  Positive for vaginal bleeding. Negative for dysuria and vaginal discharge.  Neurological: Negative.  Negative for dizziness and headaches.  Physical Exam   Blood pressure 112/85, pulse (!) 106, temperature 98.4 F (36.9 C), temperature source Oral, resp. rate 17, height 4\' 11"  (1.499 m), weight 72.5 kg, last menstrual period 04/12/2021, SpO2 100 %, not currently breastfeeding.  Physical Exam Vitals and nursing note reviewed.  Constitutional:  General: She is not in acute distress.    Appearance: She is well-developed.  HENT:     Head: Normocephalic.  Eyes:     Pupils: Pupils are equal, round, and reactive to light.  Cardiovascular:     Rate and Rhythm: Normal rate and regular rhythm.     Heart sounds: Normal heart sounds.  Pulmonary:     Effort: Pulmonary effort is normal. No respiratory distress.     Breath sounds: Normal breath sounds.  Abdominal:     General: Bowel sounds are normal. There is no distension.     Palpations: Abdomen is soft.     Tenderness: There is no abdominal tenderness.  Genitourinary:    Comments: Pelvic exam: Cervix pink, visually closed, without lesion, scant white creamy discharge, vaginal walls and external genitalia normal Bimanual exam: Cervix 0/long/high, firm   Skin:    General: Skin is warm and dry.  Neurological:     Mental Status: She is alert and oriented to  person, place, and time.  Psychiatric:        Mood and Affect: Mood normal.        Behavior: Behavior normal.        Thought Content: Thought content normal.        Judgment: Judgment normal.   FHT: 155 bpm   MAU Course  Procedures Results for orders placed or performed during the hospital encounter of 08/22/21 (from the past 24 hour(s))  Urinalysis, Routine w reflex microscopic Urine, Clean Catch     Status: Abnormal   Collection Time: 08/22/21 11:11 PM  Result Value Ref Range   Color, Urine YELLOW YELLOW   APPearance HAZY (A) CLEAR   Specific Gravity, Urine 1.027 1.005 - 1.030   pH 7.0 5.0 - 8.0   Glucose, UA NEGATIVE NEGATIVE mg/dL   Hgb urine dipstick NEGATIVE NEGATIVE   Bilirubin Urine NEGATIVE NEGATIVE   Ketones, ur NEGATIVE NEGATIVE mg/dL   Protein, ur NEGATIVE NEGATIVE mg/dL   Nitrite NEGATIVE NEGATIVE   Leukocytes,Ua NEGATIVE NEGATIVE    MDM UA Patient declines medication in MAU  Assessment and Plan   1. Fall, initial encounter   2. Musculoskeletal pain   3. [redacted] weeks gestation of pregnancy    -Discharge home in stable condition -Pain precautions discussed -Patient advised to follow-up with OB as scheduled for prenatal care -Patient may return to MAU as needed or if her condition were to change or worsen   Rolm Bookbinder CNM 08/22/2021, 11:33 PM

## 2021-08-23 DIAGNOSIS — Y9354 Activity, bowling: Secondary | ICD-10-CM | POA: Diagnosis not present

## 2021-08-23 DIAGNOSIS — O99891 Other specified diseases and conditions complicating pregnancy: Secondary | ICD-10-CM | POA: Diagnosis not present

## 2021-08-23 DIAGNOSIS — M549 Dorsalgia, unspecified: Secondary | ICD-10-CM | POA: Diagnosis not present

## 2021-08-23 DIAGNOSIS — W1839XA Other fall on same level, initial encounter: Secondary | ICD-10-CM | POA: Diagnosis not present

## 2021-08-23 DIAGNOSIS — W010XXA Fall on same level from slipping, tripping and stumbling without subsequent striking against object, initial encounter: Secondary | ICD-10-CM | POA: Diagnosis not present

## 2021-08-23 DIAGNOSIS — R109 Unspecified abdominal pain: Secondary | ICD-10-CM | POA: Diagnosis not present

## 2021-08-23 DIAGNOSIS — O26892 Other specified pregnancy related conditions, second trimester: Secondary | ICD-10-CM | POA: Diagnosis not present

## 2021-08-23 DIAGNOSIS — Z3A18 18 weeks gestation of pregnancy: Secondary | ICD-10-CM | POA: Diagnosis not present

## 2021-08-23 DIAGNOSIS — M7918 Myalgia, other site: Secondary | ICD-10-CM | POA: Diagnosis not present

## 2021-08-23 LAB — URINALYSIS, ROUTINE W REFLEX MICROSCOPIC
Bilirubin Urine: NEGATIVE
Glucose, UA: NEGATIVE mg/dL
Hgb urine dipstick: NEGATIVE
Ketones, ur: NEGATIVE mg/dL
Leukocytes,Ua: NEGATIVE
Nitrite: NEGATIVE
Protein, ur: NEGATIVE mg/dL
Specific Gravity, Urine: 1.027 (ref 1.005–1.030)
pH: 7 (ref 5.0–8.0)

## 2021-08-23 NOTE — Discharge Instructions (Signed)

## 2021-10-19 ENCOUNTER — Inpatient Hospital Stay (HOSPITAL_COMMUNITY): Payer: Medicaid Other

## 2021-10-19 ENCOUNTER — Inpatient Hospital Stay (HOSPITAL_COMMUNITY)
Admission: AD | Admit: 2021-10-19 | Discharge: 2021-10-19 | Disposition: A | Discharge status: Home / Self Care | Payer: Medicaid Other | Attending: Obstetrics and Gynecology | Admitting: Obstetrics and Gynecology

## 2021-10-19 DIAGNOSIS — R079 Chest pain, unspecified: Secondary | ICD-10-CM | POA: Diagnosis not present

## 2021-10-19 DIAGNOSIS — J069 Acute upper respiratory infection, unspecified: Secondary | ICD-10-CM | POA: Insufficient documentation

## 2021-10-19 DIAGNOSIS — Z20822 Contact with and (suspected) exposure to covid-19: Secondary | ICD-10-CM | POA: Diagnosis not present

## 2021-10-19 DIAGNOSIS — O99513 Diseases of the respiratory system complicating pregnancy, third trimester: Secondary | ICD-10-CM | POA: Insufficient documentation

## 2021-10-19 DIAGNOSIS — O26893 Other specified pregnancy related conditions, third trimester: Secondary | ICD-10-CM | POA: Diagnosis not present

## 2021-10-19 DIAGNOSIS — R059 Cough, unspecified: Secondary | ICD-10-CM | POA: Diagnosis not present

## 2021-10-19 LAB — RESP PANEL BY RT-PCR (FLU A&B, COVID) ARPGX2
Influenza A by PCR: NEGATIVE
Influenza B by PCR: NEGATIVE
SARS Coronavirus 2 by RT PCR: NEGATIVE

## 2021-10-19 MED ORDER — BENZONATATE 100 MG PO CAPS: 100.0000 mg | ORAL_CAPSULE | Freq: Three times a day (TID) | ORAL | 0 refills | Status: AC

## 2021-10-20 NOTE — MAU Provider Note (Addendum)
Patient Taylor Hodge is a 30 y.o. [id number]  At 28w1 day here for complaints of cough and chest pain. She denies SOB. She states she was diagnosed with Influenza 2 weeks ago. Today she reported feeling like she was getting sick again; she felt like she couldn't take a full breath.   Patient denies VB, decreased fetal movements, ctx, LOF, fever, HA, dysuria, NV, diarrhea, constipation.   Patient gets here care in [facility].   History     CSN: [id number]  Arrival date and time: 10/19/21 1737   None     Chief Complaint  Patient presents with   Cough   Cough This is a recurrent problem. The problem has been unchanged. The cough is Non-productive. Associated symptoms include shortness of breath. Pertinent negatives include no chest pain, chills, ear congestion, fever, headaches or rhinorrhea.   OB History     Gravida  10   Para  6   Term  2   Preterm  4   AB  3   Living  6      SAB  2   IAB  1   Ectopic      Multiple      Live Births  6           Past Medical History:  Diagnosis Date   Anemia    Anxiety    Seizures (HCC)     Past Surgical History:  Procedure Laterality Date   CESAREAN SECTION     EYE SURGERY      No family history on file.  Social History   Tobacco Use   Smoking status: Former    Types: Cigarettes   Smokeless tobacco: Never   Tobacco comments:    not currently  Vaping Use   Vaping Use: Never used  Substance Use Topics   Alcohol use: Not Currently   Drug use: Not Currently    Allergies:  Allergies  Allergen Reactions   Ibuprofen Hives   Mushroom Extract Complex Anaphylaxis   Prochlorperazine Anxiety, Anaphylaxis and Hives    hives   Toradol [Ketorolac Tromethamine] Shortness Of Breath   Ketorolac Itching and Rash   Niacin And Related Other (See Comments)    Flushing, hot flashes    No medications prior to admission.    Review of Systems  Constitutional:  Negative for chills and fever.  HENT:   Negative for rhinorrhea.   Respiratory:  Positive for cough and shortness of breath.   Cardiovascular:  Negative for chest pain.  Neurological:  Negative for headaches.  Physical Exam   Last menstrual period 04/12/2021, not currently breastfeeding.  Physical Exam Constitutional:      Appearance: Normal appearance.  Cardiovascular:     Rate and Rhythm: Normal rate and regular rhythm.     Pulses: Normal pulses.  Pulmonary:     Effort: Pulmonary effort is normal.     Breath sounds: Normal breath sounds.  Skin:    General: Skin is warm.  Neurological:     General: No focal deficit present.     Mental Status: She is alert.    MAU Course  Procedures  MDM Vitals taken by CNM were 122/77; 98.8 temp,  pulse 78, Pulse Ox 100% and resp 18.   -due to high acuity patients and high census in MAU, patient was seen and evaluated in MAU and Family Room.  -Patient felt strong active fetal movements while in MAU; NST not perfomed -Patient had her daughter with her  so 1 View chest-ray was performed> Xray results reviewed with Dr. Ephriam Jenkins, who reports that X-ray is typical for URI with congestion. Could consider 2 view but patient states she does not have childcare so declined Xray -EKG reviewed with Dr. Ephriam Jenkins, who finds EKG reassuring.  -RSP panel is negative  Assessment and Plan   1. Viral upper respiratory tract infection   2. Chest pain    -patient was observed for several hours while in MAU, she is ambulating, talking without complaint or difficulty.  -patient to be discharged home with recommendations for supportive care; patient given instructions on when to return to nearest emergency room, including fever, worsening chest pain or SOB. 2nd trimester precautions reviewed.  -patient to keep upcoming appt at [facility] -plan of care discussed with Dr. Charlotta Newton, who agrees safe for discharge with strict return precautions. Charlesetta Garibaldi [place] Gambone 10/20/2021, 8:22 AM

## 2021-11-14 ENCOUNTER — Inpatient Hospital Stay (HOSPITAL_COMMUNITY)
Admission: AD | Admit: 2021-11-14 | Discharge: 2021-11-14 | Disposition: A | Discharge status: Home / Self Care | Payer: Commercial Managed Care - HMO | Attending: Obstetrics and Gynecology | Admitting: Obstetrics and Gynecology

## 2021-11-14 ENCOUNTER — Encounter (HOSPITAL_COMMUNITY): Payer: Self-pay | Admitting: Obstetrics and Gynecology

## 2021-11-14 ENCOUNTER — Other Ambulatory Visit: Payer: Self-pay

## 2021-11-14 DIAGNOSIS — O3433 Maternal care for cervical incompetence, third trimester: Secondary | ICD-10-CM | POA: Insufficient documentation

## 2021-11-14 DIAGNOSIS — Z3A31 31 weeks gestation of pregnancy: Secondary | ICD-10-CM | POA: Insufficient documentation

## 2021-11-14 DIAGNOSIS — O4703 False labor before 37 completed weeks of gestation, third trimester: Secondary | ICD-10-CM | POA: Diagnosis present

## 2021-11-14 DIAGNOSIS — Z3689 Encounter for other specified antenatal screening: Secondary | ICD-10-CM | POA: Diagnosis not present

## 2021-11-14 DIAGNOSIS — Z9181 History of falling: Secondary | ICD-10-CM | POA: Insufficient documentation

## 2021-11-14 DIAGNOSIS — O09213 Supervision of pregnancy with history of pre-term labor, third trimester: Secondary | ICD-10-CM | POA: Diagnosis not present

## 2021-11-14 DIAGNOSIS — R109 Unspecified abdominal pain: Secondary | ICD-10-CM | POA: Insufficient documentation

## 2021-11-14 DIAGNOSIS — M549 Dorsalgia, unspecified: Secondary | ICD-10-CM | POA: Insufficient documentation

## 2021-11-14 DIAGNOSIS — O09893 Supervision of other high risk pregnancies, third trimester: Secondary | ICD-10-CM | POA: Diagnosis not present

## 2021-11-14 DIAGNOSIS — O26893 Other specified pregnancy related conditions, third trimester: Secondary | ICD-10-CM | POA: Diagnosis not present

## 2021-11-14 DIAGNOSIS — Z3A32 32 weeks gestation of pregnancy: Secondary | ICD-10-CM | POA: Diagnosis not present

## 2021-11-14 LAB — URINALYSIS, ROUTINE W REFLEX MICROSCOPIC
Bilirubin Urine: NEGATIVE
Glucose, UA: NEGATIVE mg/dL
Hgb urine dipstick: NEGATIVE
Ketones, ur: 20 mg/dL — AB
Leukocytes,Ua: NEGATIVE
Nitrite: NEGATIVE
Protein, ur: NEGATIVE mg/dL
Specific Gravity, Urine: 1.019 (ref 1.005–1.030)
pH: 5 (ref 5.0–8.0)

## 2021-11-14 MED ORDER — LACTATED RINGERS IV BOLUS
1000.0000 mL | Freq: Once | INTRAVENOUS | Status: AC
  Administered 2021-11-14: 18:00:00 1000 mL via INTRAVENOUS

## 2021-11-14 MED ORDER — BETAMETHASONE SOD PHOS & ACET 6 (3-3) MG/ML IJ SUSP
12.0000 mg | Freq: Once | INTRAMUSCULAR | Status: AC
  Administered 2021-11-14: 18:00:00 12 mg via INTRAMUSCULAR
  Filled 2021-11-14: qty 5

## 2021-11-14 MED ORDER — NIFEDIPINE 10 MG PO CAPS
10.0000 mg | ORAL_CAPSULE | ORAL | Status: AC | PRN
  Administered 2021-11-14 (×3): 10 mg via ORAL
  Filled 2021-11-14 (×3): qty 1

## 2021-11-14 MED ORDER — ACETAMINOPHEN 500 MG PO TABS
1000.0000 mg | ORAL_TABLET | Freq: Once | ORAL | Status: AC
  Administered 2021-11-14: 19:00:00 1000 mg via ORAL
  Filled 2021-11-14: qty 2

## 2021-11-14 NOTE — MAU Provider Note (Signed)
History     CSN: [id number]  Arrival date and time: 11/14/21 1658   Event Date/Time   First Provider Initiated Contact with Patient 11/14/21 1740      Chief Complaint  Patient presents with   Contractions   HPI Taylor Hodge is a 31 y.o. [id number] at [redacted]w[redacted]d who presents with abdominal pain. Reports feeling painful contractions "every few minutes" since last week that have worsened since this morning. Rates pain 7/10. No aggravating or alleviating factors. Hasn't treated symptoms. Saw her OB in [facility] last week & had a positive FFN. She has history of 4 preterm deliveries, earliest being 34 weeks.  Was treated for yeast infection, completed treatment a few days ago. Had episode of spotting the other day but bleeding has not continued.  Reports she fell 2 nights ago landing on her left side. Did not seek treatment at that time. Had decreased fetal movement yesterday and today with return of normal movement currently.   OB History     Gravida  10   Para  6   Term  2   Preterm  4   AB  3   Living  6      SAB  2   IAB  1   Ectopic      Multiple      Live Births  6           Past Medical History:  Diagnosis Date   Anemia    Anxiety    Seizures (HCC)     Past Surgical History:  Procedure Laterality Date   CESAREAN SECTION     EYE SURGERY      History reviewed. No pertinent family history.  Social History   Tobacco Use   Smoking status: Former    Types: Cigarettes   Smokeless tobacco: Never   Tobacco comments:    not currently  Vaping Use   Vaping Use: Never used  Substance Use Topics   Alcohol use: Not Currently   Drug use: Not Currently    Allergies:  Allergies  Allergen Reactions   Ibuprofen Hives   Mushroom Extract Complex Anaphylaxis   Prochlorperazine Anxiety, Anaphylaxis and Hives    hives   Toradol [Ketorolac Tromethamine] Shortness Of Breath   Ketorolac Itching and Rash   Niacin And Related Other (See Comments)     Flushing, hot flashes    Medications Prior to Admission  Medication Sig Dispense Refill Last Dose   lamoTRIgine (LAMICTAL) 150 MG tablet Take 150 mg by mouth 2 (two) times daily.   11/14/2021   benzonatate (TESSALON) 100 MG capsule Take 1 capsule (100 mg total) by mouth every 8 (eight) hours. 21 capsule 0    folic acid (FOLVITE) 1 MG tablet Take 1 mg by mouth daily.      magnesium gluconate (MAGONATE) 500 MG tablet Take 500 mg by mouth 2 (two) times daily.      ondansetron (ZOFRAN) 8 MG tablet Take 1 tablet (8 mg total) by mouth every 8 (eight) hours as needed for nausea or vomiting. 20 tablet 0     Review of Systems  Constitutional: Negative.   Gastrointestinal:  Positive for abdominal pain. Negative for diarrhea, nausea and vomiting.  Musculoskeletal:  Positive for back pain.  Physical Exam   Blood pressure 132/77, pulse (!) 115, temperature 98.5 F (36.9 C), temperature source Oral, resp. rate 18, height 5' (1.524 m), weight 72.3 kg, last menstrual period 04/12/2021, SpO2 98 %.  Physical Exam Vitals  and nursing note reviewed. Exam conducted with a chaperone present.  Constitutional:      General: She is not in acute distress.    Appearance: Normal appearance. She is not ill-appearing.  HENT:     Head: Normocephalic and atraumatic.  Eyes:     General: No scleral icterus.    Conjunctiva/sclera: Conjunctivae normal.  Pulmonary:     Effort: Pulmonary effort is normal. No respiratory distress.  Abdominal:     Palpations: Abdomen is soft.     Tenderness: There is no abdominal tenderness.     Comments: Contractions palpated mild  Genitourinary:    Comments: Dilation: 3 Effacement (%): Thick Station: Ballotable Presentation: Vertex (by bedside u/s per Estanislado Spire, NP) Exam by:: Judeth Horn NP  Skin:    General: Skin is warm and dry.  Neurological:     General: No focal deficit present.     Mental Status: She is alert.  Psychiatric:        Mood and Affect: Mood normal.         Behavior: Behavior normal.    NST:  Baseline: 150 bpm, Variability: Good {> 6 bpm), Accelerations: Reactive, and Decelerations: Absent   Ultrasound Pt informed that the ultrasound is considered a limited OB ultrasound and is not intended to be a complete ultrasound exam.  Patient also informed that the ultrasound is not being completed with the intent of assessing for fetal or placental anomalies or any pelvic abnormalities.  Explained that the purpose of todays ultrasound is to assess for  presentation.  Patient acknowledges the purpose of the exam and the limitations of the study.    Impression: cephalic MAU Course  Procedures Results for orders placed or performed during the hospital encounter of 11/14/21 (from the past 24 hour(s))  Urinalysis, Routine w reflex microscopic Urine, Clean Catch     Status: Abnormal   Collection Time: 11/14/21  6:00 PM  Result Value Ref Range   Color, Urine YELLOW YELLOW   APPearance CLEAR CLEAR   Specific Gravity, Urine 1.019 1.005 - 1.030   pH 5.0 5.0 - 8.0   Glucose, UA NEGATIVE NEGATIVE mg/dL   Hgb urine dipstick NEGATIVE NEGATIVE   Bilirubin Urine NEGATIVE NEGATIVE   Ketones, ur 20 (A) NEGATIVE mg/dL   Protein, ur NEGATIVE NEGATIVE mg/dL   Nitrite NEGATIVE NEGATIVE   Leukocytes,Ua NEGATIVE NEGATIVE    MDM Patient presents with contractions. Cervix 2.5/thick/ballotable. Per care everywhere, she was closed at her ob visit last week but had a positive FFN.  Patient states she hasn't received BMZ so it was given today in MAU.  IV LR bolus & procardia series given for ctx. Patient reports improvement in pain but not frequency. Cervix has made minor change to 3 cm but still thick. After an additional hour of monitoring her cervix is unchanged. No regular contractions on the monitor & the patient appears comfortable.  Will discharge home. Patient will return tomorrow evening for second BMZ injection. Will need monitoring & SVE if reports  contractions.   Patient states she lives close to the hospital & will return if symptoms worsen.  Assessment and Plan   1. Preterm uterine contractions in third trimester, antepartum   2. Hx of preterm delivery, currently pregnant, third trimester   3. Premature cervical dilation in third trimester   4. [redacted] weeks gestation of pregnancy    -return to MAU tomorrow for second BMZ -return as needed for worsening symptoms   Judeth Horn 11/14/2021, 5:40 PM

## 2021-11-14 NOTE — MAU Note (Signed)
Taylor Hodge is a 31 y.o. at [redacted]w[redacted]d here in MAU reporting: fell 2 days ago. Had bleeding last night but it has stopped. Was having right sided pain and then 2 hours ago started having contractions every 5-6 min. No LOF.   Onset of complaint: today  Pain score: 7/10  Vitals:   11/14/21 1716  BP: 132/77  Pulse: (!) 115  Resp: 18  Temp: 98.5 F (36.9 C)  SpO2: 98%     FHT:145  Lab orders placed from triage: UA

## 2021-11-15 ENCOUNTER — Other Ambulatory Visit: Payer: Self-pay

## 2021-11-15 ENCOUNTER — Ambulatory Visit (HOSPITAL_COMMUNITY)
Admit: 2021-11-15 | Discharge: 2021-11-15 | Disposition: A | Discharge status: Home / Self Care | Payer: Medicaid Other | Attending: Obstetrics and Gynecology | Admitting: Obstetrics and Gynecology

## 2021-11-15 ENCOUNTER — Inpatient Hospital Stay (HOSPITAL_COMMUNITY)
Admission: AD | Admit: 2021-11-15 | Discharge: 2021-11-15 | Disposition: A | Discharge status: Home / Self Care | Payer: 59 | Attending: Obstetrics and Gynecology | Admitting: Obstetrics and Gynecology

## 2021-11-15 DIAGNOSIS — Z3A32 32 weeks gestation of pregnancy: Secondary | ICD-10-CM | POA: Insufficient documentation

## 2021-11-15 DIAGNOSIS — O4703 False labor before 37 completed weeks of gestation, third trimester: Secondary | ICD-10-CM | POA: Diagnosis present

## 2021-11-15 MED ORDER — BETAMETHASONE SOD PHOS & ACET 6 (3-3) MG/ML IJ SUSP
12.0000 mg | Freq: Once | INTRAMUSCULAR | Status: AC
  Administered 2021-11-15: 12 mg via INTRAMUSCULAR

## 2021-11-15 NOTE — Discharge Instructions (Signed)

## 2021-11-15 NOTE — MAU Note (Signed)
Pt here for second steroid injection. Reports she has some cramping but it is mild and not like yesterday. Had some spotting this morning but thought it ws because of the cervical exa. Good fetal movement reported.

## 2021-11-15 NOTE — MAU Provider Note (Signed)
Event Date/Time   First Provider Initiated Contact with Patient 11/15/21 1800     S Ms. Taylor Hodge is a 31 y.o. [id number] patient who presents to MAU today for a second betamethasone injection. She reports feeling much better today and does not feel like she is in labor. She denies bleeding or leaking. Reports normal fetal movement. .   O BP 111/76    Pulse (!) 117    Resp 18    LMP 04/12/2021  Physical Exam Vitals and nursing note reviewed.  Constitutional:      General: She is not in acute distress.    Appearance: She is well-developed.  HENT:     Head: Normocephalic.  Eyes:     Pupils: Pupils are equal, round, and reactive to light.  Cardiovascular:     Rate and Rhythm: Normal rate and regular rhythm.     Heart sounds: Normal heart sounds.  Pulmonary:     Effort: Pulmonary effort is normal. No respiratory distress.     Breath sounds: Normal breath sounds.  Abdominal:     General: Bowel sounds are normal. There is no distension.     Palpations: Abdomen is soft.     Tenderness: There is no abdominal tenderness.  Skin:    General: Skin is warm and dry.  Neurological:     Mental Status: She is alert and oriented to person, place, and time.  Psychiatric:        Mood and Affect: Mood normal.        Behavior: Behavior normal.        Thought Content: Thought content normal.        Judgment: Judgment normal.   FHT: 145 bpm  A Medical screening exam complete 1. Threatened premature labor in third trimester   2. [redacted] weeks gestation of pregnancy    BMZ today  P -Discharge home in stable condition -Preterm labor precautions discussed -Patient advised to follow-up with [facility] on Thursday as scheduled for prenatal care -Patient may return to MAU as needed or if her condition were to change or worsen   Rolm Bookbinder, [place] 11/15/2021 6:00 PM

## 2022-01-16 IMAGING — US US OB COMP LESS 14 WK
1 series · 15 of 28 positions shown · non-contrast
Comparison: 01/03/2021

CLINICAL DATA: Abdominal pain and first-trimester pregnancy

EXAM:
OBSTETRIC <14 WK ULTRASOUND
TECHNIQUE: Transabdominal ultrasound was performed for evaluation of the
gestation as well as the maternal uterus and adnexal regions.

[Series 1: us ob comp less 14 wk · 15 of 41 slices shown]
[im 1/41]
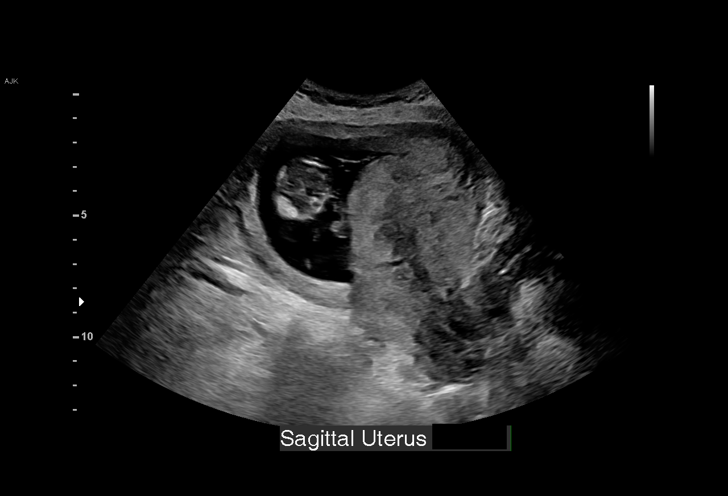
[im 3/41]
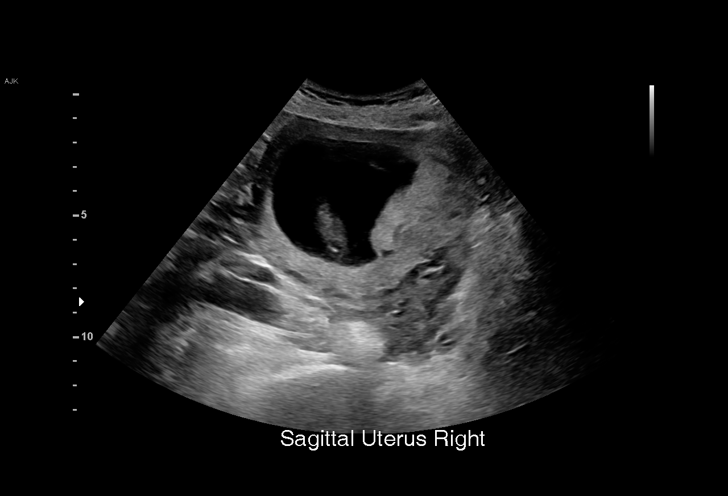
[im 6/41]
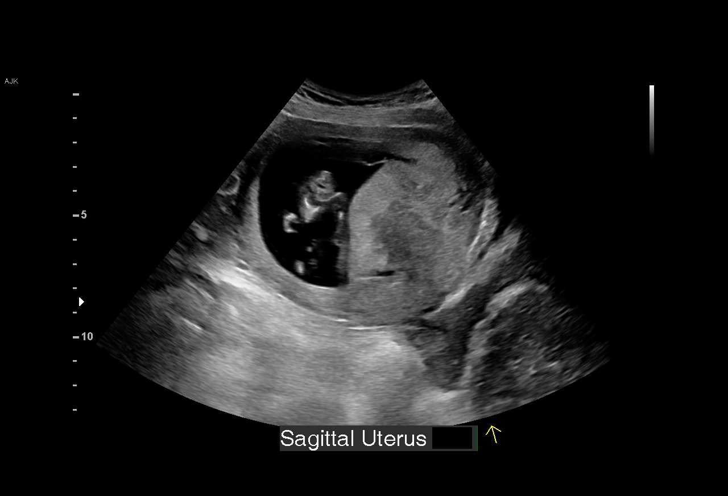
[im 9/41]
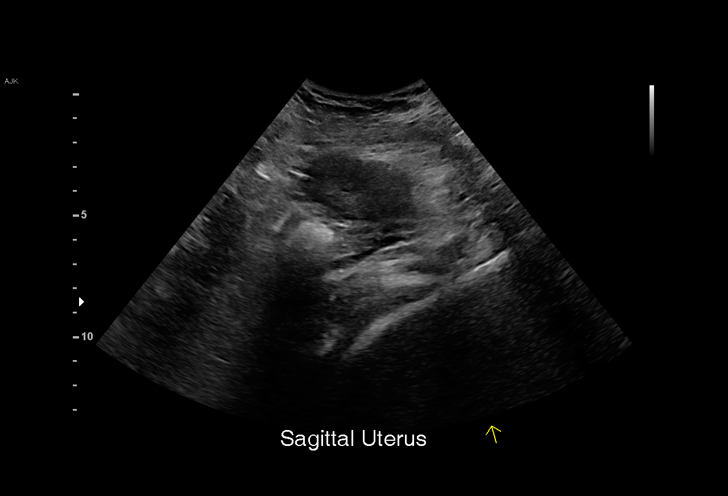
[im 12/41]
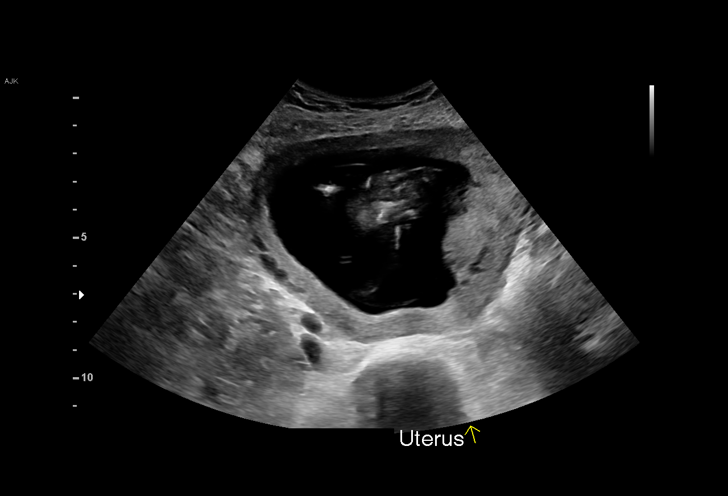
[im 15/41]
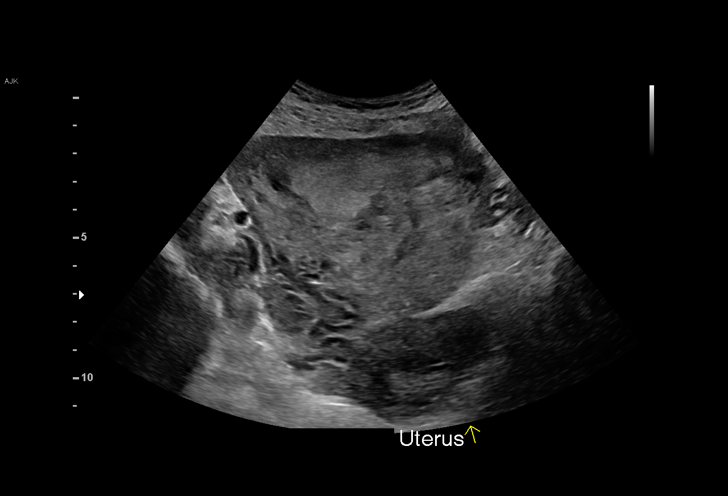
[im 18/41]
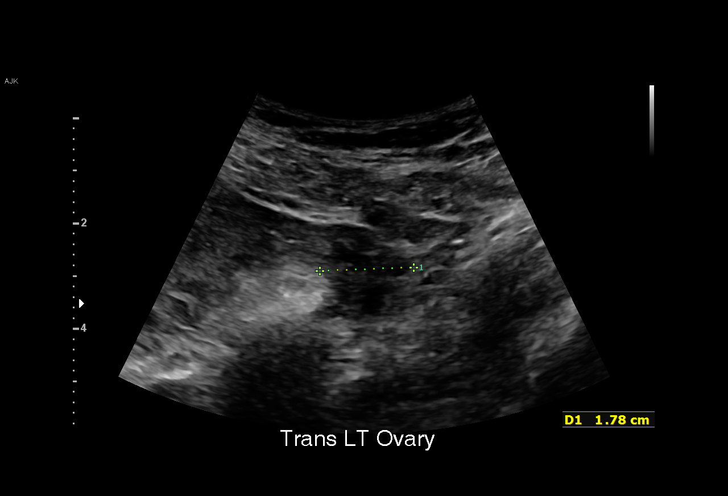
[im 21/41]
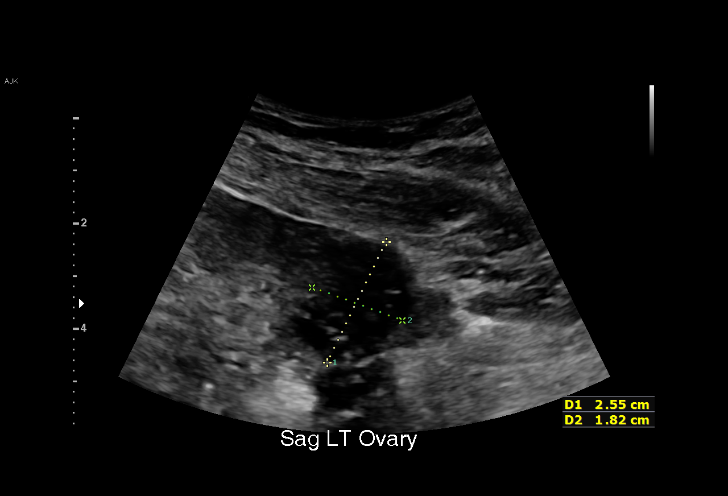
[im 23/41]
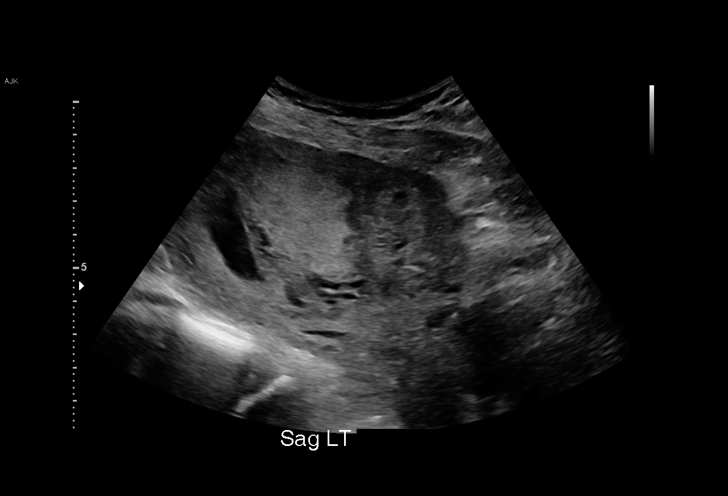
[im 26/41]
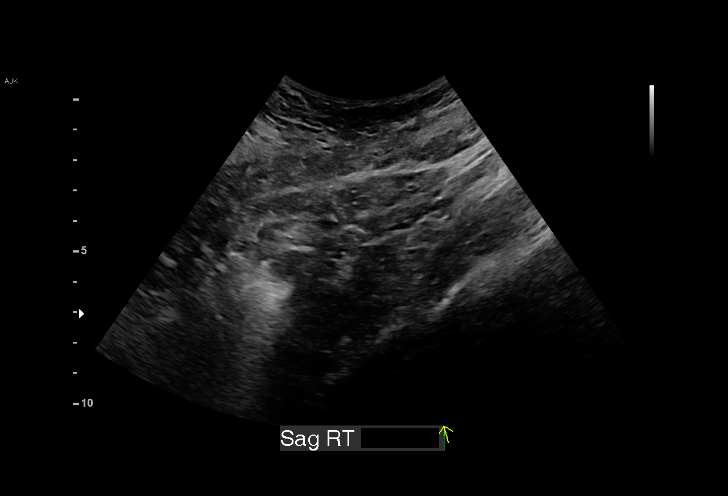
[im 29/41]
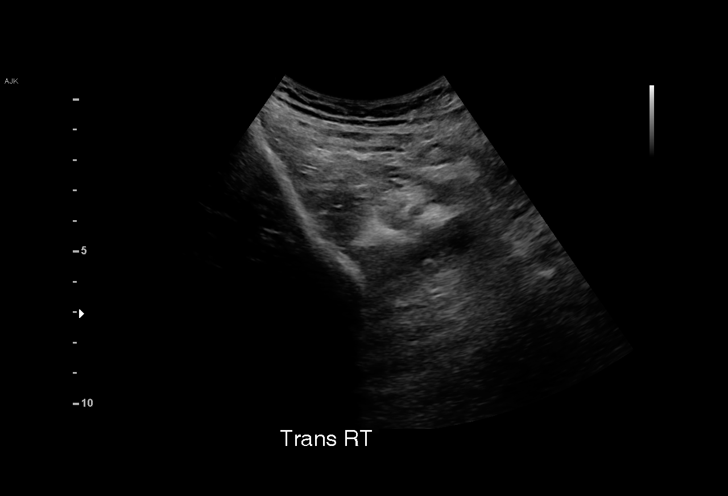
[im 32/41]
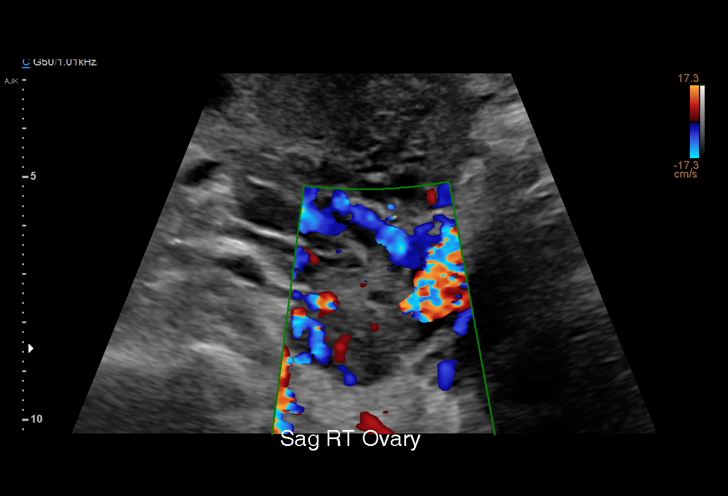
[im 35/41]
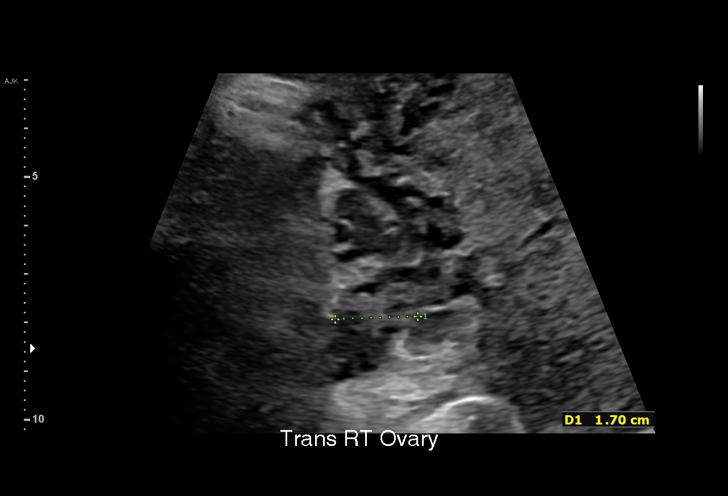
[im 38/41]
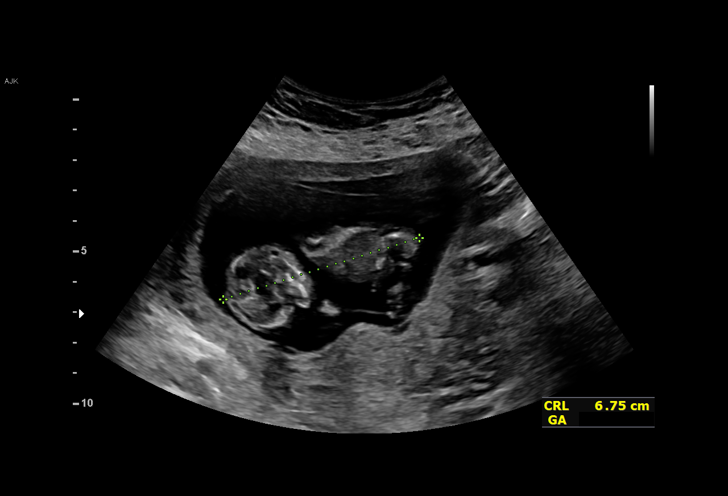
[im 41/41]
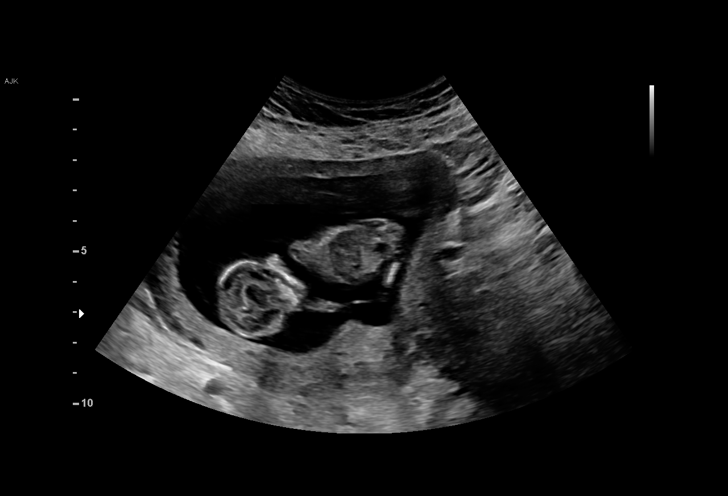

[15 of 28 positions shown; findings below may reference images not displayed]

FINDINGS: Intrauterine gestational sac: Single

Yolk sac:  Not Visualized.

Embryo:  Visualized.

Cardiac Activity: Visualized.

Heart Rate: 153 bpm

CRL:   67.9 mm   13 w 0 d                  US EDC: 01/12/2022

Subchorionic hemorrhage:  None visualized.

Maternal uterus/adnexae: Unremarkable appearance of the adnexa and
pelvic recesses.
IMPRESSION: Single living intrauterine pregnancy measuring 13 weeks. No
pathologic finding.

## 2022-04-30 IMAGING — DX DG CHEST 1V PORT
1 series · 1 of 1 positions shown · non-contrast
Comparison: Two-view chest x-ray 05/13/2021

CLINICAL DATA: Chest pain and shortness of breath. Dry cough. Flu 2
weeks ago.

EXAM:
PORTABLE CHEST 1 VIEW

[chest]
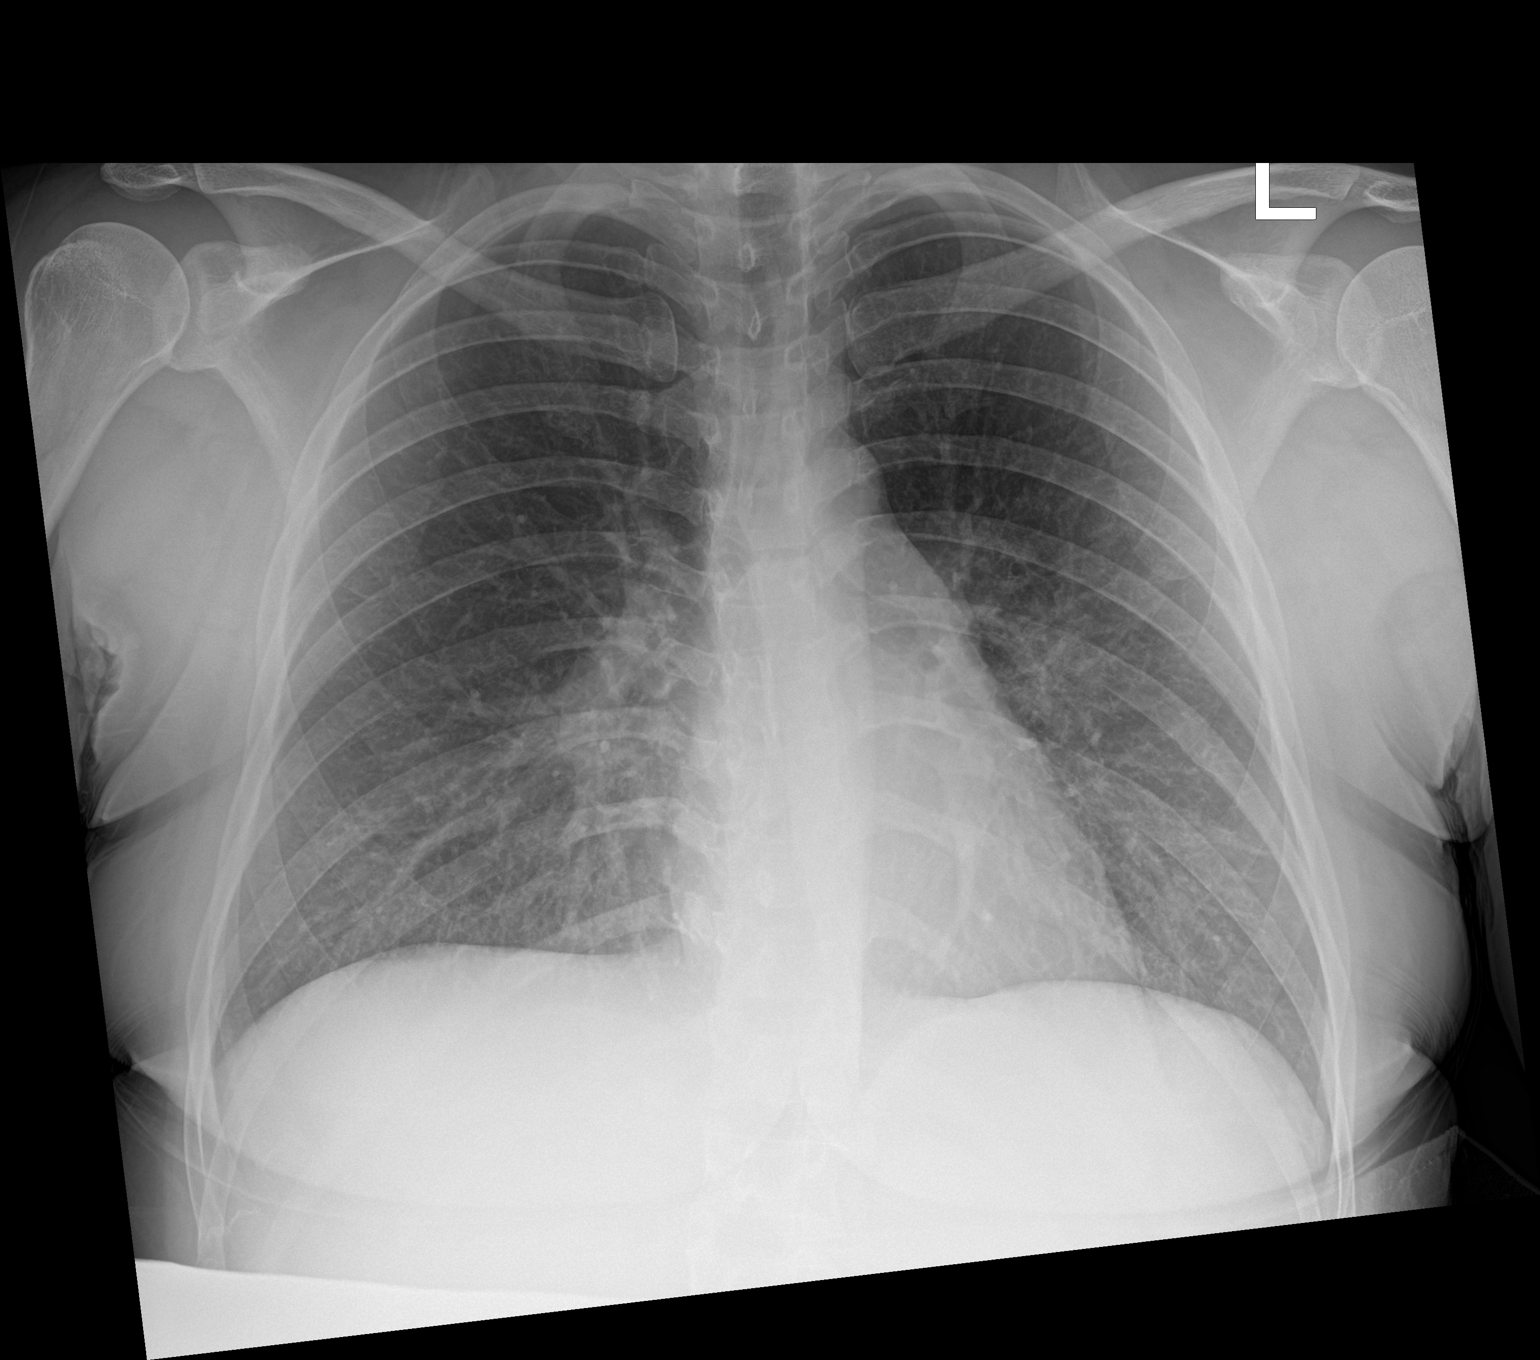

[1 of 1 positions shown; findings below may reference images not displayed]

FINDINGS: The heart size is normal. Perihilar opacities noted bilaterally.
Peripheral lungs are clear. Axial skeleton is within normal limits.
IMPRESSION: Bilateral perihilar airspace disease concerning for viral infection
versus is mild pulmonary vascular congestion.
